# Patient Record
Sex: Male | Born: 1960 | Race: White | Hispanic: No | Marital: Married | Smoking: Never smoker
Health system: Southern US, Community
[De-identification: ages and names within clinical notes are randomized; demographics above are authoritative.]

## PROBLEM LIST (undated history)

## (undated) DIAGNOSIS — M23207 Derangement of unspecified meniscus due to old tear or injury, left knee: Secondary | ICD-10-CM

## (undated) DIAGNOSIS — G4733 Obstructive sleep apnea (adult) (pediatric): Secondary | ICD-10-CM

## (undated) DIAGNOSIS — M1990 Unspecified osteoarthritis, unspecified site: Secondary | ICD-10-CM

## (undated) DIAGNOSIS — Z0289 Encounter for other administrative examinations: Secondary | ICD-10-CM

## (undated) DIAGNOSIS — R011 Cardiac murmur, unspecified: Secondary | ICD-10-CM

## (undated) DIAGNOSIS — T7840XA Allergy, unspecified, initial encounter: Secondary | ICD-10-CM

## (undated) DIAGNOSIS — G4700 Insomnia, unspecified: Secondary | ICD-10-CM

## (undated) DIAGNOSIS — E785 Hyperlipidemia, unspecified: Secondary | ICD-10-CM

## (undated) DIAGNOSIS — E669 Obesity, unspecified: Secondary | ICD-10-CM

## (undated) DIAGNOSIS — E539 Vitamin B deficiency, unspecified: Secondary | ICD-10-CM

## (undated) DIAGNOSIS — F329 Major depressive disorder, single episode, unspecified: Secondary | ICD-10-CM

## (undated) DIAGNOSIS — F419 Anxiety disorder, unspecified: Secondary | ICD-10-CM

## (undated) DIAGNOSIS — F32A Depression, unspecified: Secondary | ICD-10-CM

## (undated) DIAGNOSIS — E291 Testicular hypofunction: Secondary | ICD-10-CM

## (undated) DIAGNOSIS — G4730 Sleep apnea, unspecified: Secondary | ICD-10-CM

## (undated) DIAGNOSIS — M25562 Pain in left knee: Secondary | ICD-10-CM

## (undated) DIAGNOSIS — M7662 Achilles tendinitis, left leg: Secondary | ICD-10-CM

## (undated) DIAGNOSIS — R718 Other abnormality of red blood cells: Secondary | ICD-10-CM

## (undated) DIAGNOSIS — R319 Hematuria, unspecified: Secondary | ICD-10-CM

## (undated) HISTORY — DX: Obesity, unspecified: E66.9

## (undated) HISTORY — DX: Derangement of unspecified meniscus due to old tear or injury, left knee: M23.207

## (undated) HISTORY — DX: Sleep apnea, unspecified: G47.30

## (undated) HISTORY — DX: Allergy, unspecified, initial encounter: T78.40XA

## (undated) HISTORY — DX: Hyperlipidemia, unspecified: E78.5

## (undated) HISTORY — DX: Pain in left knee: M25.562

## (undated) HISTORY — DX: Anxiety disorder, unspecified: F41.9

## (undated) HISTORY — DX: Other abnormality of red blood cells: R71.8

## (undated) HISTORY — DX: Depression, unspecified: F32.A

## (undated) HISTORY — DX: Achilles tendinitis, left leg: M76.62

## (undated) HISTORY — DX: Testicular hypofunction: E29.1

## (undated) HISTORY — DX: Hematuria, unspecified: R31.9

## (undated) HISTORY — DX: Unspecified osteoarthritis, unspecified site: M19.90

## (undated) HISTORY — DX: Cardiac murmur, unspecified: R01.1

## (undated) HISTORY — PX: VASECTOMY: SHX75

## (undated) HISTORY — DX: Insomnia, unspecified: G47.00

## (undated) HISTORY — DX: Encounter for other administrative examinations: Z02.89

## (undated) HISTORY — DX: Major depressive disorder, single episode, unspecified: F32.9

## (undated) HISTORY — DX: Obstructive sleep apnea (adult) (pediatric): G47.33

---

## 2010-06-15 LAB — HM COLONOSCOPY: HM Colonoscopy: NORMAL

## 2010-06-25 ENCOUNTER — Ambulatory Visit: Payer: Self-pay | Admitting: Gastroenterology

## 2011-01-10 ENCOUNTER — Ambulatory Visit: Payer: Self-pay | Admitting: Family Medicine

## 2011-05-09 ENCOUNTER — Ambulatory Visit: Payer: Self-pay | Admitting: Family Medicine

## 2014-01-25 LAB — PSA: PSA: NORMAL

## 2014-08-16 HISTORY — PX: KNEE SURGERY: SHX244

## 2014-08-29 ENCOUNTER — Ambulatory Visit: Payer: Self-pay | Admitting: Specialist

## 2014-08-30 LAB — HEMOGLOBIN A1C: Hgb A1c MFr Bld: 5.4 % (ref 4.0–6.0)

## 2014-09-12 ENCOUNTER — Ambulatory Visit: Payer: Self-pay | Admitting: Anesthesiology

## 2014-09-13 ENCOUNTER — Ambulatory Visit: Payer: Self-pay | Admitting: Specialist

## 2014-12-30 LAB — LIPID PANEL
Cholesterol: 153 mg/dL (ref 0–200)
HDL: 42 mg/dL (ref 35–70)
LDL Cholesterol: 85 mg/dL
Triglycerides: 130 mg/dL (ref 40–160)

## 2015-04-08 NOTE — Op Note (Signed)
PATIENT NAME:  Willie Atkins, Saman M MR#:  [id number] DATE OF BIRTH:  1960-12-18  DATE OF PROCEDURE:  09/13/2014  PREOPERATIVE DIAGNOSIS: Medial meniscus tear, left knee.   POSTOPERATIVE DIAGNOSES:  1.  Radial tear posterior horn medial meniscus, left knee.  2.  Medial suprapatellar plica.   PROCEDURES:  1.  Arthroscopic partial medial meniscectomy, left knee.  2.  Arthroscopic resection medial suprapatellar plica.   SURGEON: Myra Rudehristopher Darin Redmann, MD   ANESTHESIA: General.   COMPLICATIONS: None.   ESTIMATED BLOOD LOSS: 50 mL.   DESCRIPTION OF PROCEDURE: Two grams of Ancef was given intravenously prior to the procedure. General anesthesia was induced. The left lower extremity was placed in the leg holder in the usual manner for arthroscopy. The left knee and leg are thoroughly prepped with alcohol and ChloraPrep and draped in standard sterile fashion. The joint is infiltrated with 12 mL of Marcaine with epinephrine. Diagnostic arthroscopy is performed. There is mild synovitis in the suprapatellar pouch. There is a medial suprapatellar plica. There is moderate chondromalacia of the articular surface of the patella. The anterior femur surface is normal. In the medial compartment, there is a radial tear of the posterior horn of the medial meniscus. Using a combination of the full radial resector and the ArthroWand, the torn portion of the meniscus is resected back to a stable rim. The articular surfaces are normal in the medial compartment. In the intercondylar notch, the anterior cruciate ligament is intact. In the lateral compartment, the lateral meniscus is normal and the articular surfaces are normal. Attention is then returned to the medial suprapatellar plica and using the full radial resector and the ArthroWand, the torn portion of the meniscus is resected back out of harms way. The joint is thoroughly irrigated multiple times. Skin edges are closed with 5-0 nylon. The joint is infiltrated with 15 mL  of Marcaine with epinephrine and 4 mg of morphine. A soft bulky dressing is applied. The patient is returned to the recovery room in satisfactory condition having tolerated the procedure quite well.   ____________________________ Clare Gandyhristopher E. Majesty Stehlin, MD ces:TT D: 09/13/2014 14:16:03 ET T: 09/13/2014 14:37:21 ET JOB#: [id number]  cc: Clare Gandyhristopher E. Naod Sweetland, MD, <Dictator> Clare GandyHRISTOPHER E Zacheriah Stumpe MD ELECTRONICALLY SIGNED 09/16/2014 10:30

## 2015-08-14 ENCOUNTER — Encounter (INDEPENDENT_AMBULATORY_CARE_PROVIDER_SITE_OTHER): Payer: Self-pay

## 2015-08-14 ENCOUNTER — Ambulatory Visit (INDEPENDENT_AMBULATORY_CARE_PROVIDER_SITE_OTHER): Payer: 59 | Admitting: Family Medicine

## 2015-08-14 ENCOUNTER — Encounter: Payer: Self-pay | Admitting: Family Medicine

## 2015-08-14 VITALS — BP 122/86 | HR 87 | Temp 98.3°F | Resp 16 | Ht 73.0 in | Wt 291.2 lb

## 2015-08-14 DIAGNOSIS — Z79899 Other long term (current) drug therapy: Secondary | ICD-10-CM

## 2015-08-14 DIAGNOSIS — E8881 Metabolic syndrome: Secondary | ICD-10-CM | POA: Diagnosis not present

## 2015-08-14 DIAGNOSIS — Z23 Encounter for immunization: Secondary | ICD-10-CM | POA: Diagnosis not present

## 2015-08-14 DIAGNOSIS — E785 Hyperlipidemia, unspecified: Secondary | ICD-10-CM

## 2015-08-14 DIAGNOSIS — N644 Mastodynia: Secondary | ICD-10-CM

## 2015-08-14 DIAGNOSIS — K429 Umbilical hernia without obstruction or gangrene: Secondary | ICD-10-CM | POA: Diagnosis not present

## 2015-08-14 DIAGNOSIS — E291 Testicular hypofunction: Secondary | ICD-10-CM

## 2015-08-14 DIAGNOSIS — G4733 Obstructive sleep apnea (adult) (pediatric): Secondary | ICD-10-CM | POA: Diagnosis not present

## 2015-08-14 DIAGNOSIS — F418 Other specified anxiety disorders: Secondary | ICD-10-CM

## 2015-08-14 DIAGNOSIS — Z9889 Other specified postprocedural states: Secondary | ICD-10-CM | POA: Insufficient documentation

## 2015-08-14 DIAGNOSIS — E669 Obesity, unspecified: Secondary | ICD-10-CM

## 2015-08-14 DIAGNOSIS — Z9989 Dependence on other enabling machines and devices: Secondary | ICD-10-CM

## 2015-08-14 DIAGNOSIS — M7662 Achilles tendinitis, left leg: Secondary | ICD-10-CM | POA: Insufficient documentation

## 2015-08-14 DIAGNOSIS — D582 Other hemoglobinopathies: Secondary | ICD-10-CM | POA: Insufficient documentation

## 2015-08-14 MED ORDER — ALPRAZOLAM 0.5 MG PO TABS: 0.5000 mg | ORAL_TABLET | Freq: Every evening | ORAL | Status: DC | PRN

## 2015-08-14 NOTE — Progress Notes (Signed)
Name: Willie Atkins   MRN: [id number]    DOB: 01/30/1961   Date:08/14/2015       Progress Note  Subjective  Chief Complaint  Chief Complaint  Patient presents with  . Nipple Pain    Left side Onset-Months and getting worst. Has a stinging sensation when pressure is applied.     HPI  Nipple pain: started gradually over the past couple of months, getting more sensitive. There is constant discomfort, aching pain, and when touched it is sharp 4/10 pain, no nipple discharge, no breast masses, no trauma that he can recall. He takes Meloxicam daily for his knee and achilles tendinitis and is not controlling the pain.  Dyslipidemia: he is now off Trilipix and we will recheck labs to see if we will ned to resume medication.   OSA on CPAP: very complaint with CPAP machine, he wears it every night , all night, he notices a great improvement of his level of energy. Leg cramps resolved.   Metabolic Syndrome: he has gained weight since last visit, travelling more for his work and is eating out. He denies polyphagia, polyuria or polydipsia  Depression/Anxiety: he is still taking Citalopram and alprazolam prn at night. He is still having counseling with his wife. Just switched therapist because the previous one retired. He denies suicide thoughts or ideation.    Patient Active Problem List   Diagnosis Date Noted  . Dyslipidemia 08/14/2015  . Obesity (BMI 30-39.9) 08/14/2015  . Hypogonadism male 08/14/2015  . Metabolic syndrome 08/14/2015  . Depression with anxiety 08/14/2015  . OSA on CPAP 08/14/2015  . S/P arthroscopic surgery of left knee 08/14/2015  . Abnormal hemoglobin (Hgb) 08/14/2015    Past Surgical History  Procedure Laterality Date  . Knee surgery Left 08/2014  . Vasectomy      Family History  Problem Relation Age of Onset  . Cancer Mother   . Heart disease Father   . Stroke Father     Social History   Social History  . Marital Status: Married    Spouse Name: N/A  .  Number of Children: N/A  . Years of Education: N/A   Occupational History  . Not on file.   Social History Main Topics  . Smoking status: Never Smoker   . Smokeless tobacco: Never Used  . Alcohol Use: 4.2 oz/week    7 Standard drinks or equivalent per week     Comment: Wine with dinner nightly  . Drug Use: No  . Sexual Activity: No   Other Topics Concern  . Not on file   Social History Narrative     Current outpatient prescriptions:  .  ALPRAZolam (XANAX) 0.5 MG tablet, Take 1 tablet (0.5 mg total) by mouth at bedtime as needed for anxiety., Disp: 30 tablet, Rfl: 2 .  aspirin 81 MG tablet, Take 81 mg by mouth daily., Disp: , Rfl:  .  citalopram (CELEXA) 20 MG tablet, Take 20 mg by mouth daily., Disp: , Rfl:  .  meloxicam (MOBIC) 15 MG tablet, Take 15 mg by mouth daily., Disp: , Rfl:   No Known Allergies   ROS  Constitutional: Negative for fever and positive for weight gain .  Respiratory: Negative for cough and shortness of breath.   Cardiovascular: Negative for chest pain or palpitations.  Gastrointestinal: Negative for abdominal pain, no bowel changes.  Musculoskeletal: Negative for gait problem or joint swelling.  Skin: Negative for rash.  Neurological: Negative for dizziness or headache.  No  other specific complaints in a complete review of systems (except as listed in HPI above).  Objective  Filed Vitals:   08/14/15 0823  BP: 122/86  Pulse: 87  Temp: 98.3 F (36.8 C)  TempSrc: Oral  Resp: 16  Height:  (1.854 m)  Weight: 291 lb 3.2 oz (132.087 kg)  SpO2: 98%    Body mass index is 38.43 kg/(m^2).  Physical Exam  Constitutional: Patient appears well-developed and well-nourished. Obese  No distress.  HEENT: head atraumatic, normocephalic, pupils equal and reactive to light, neck supple, throat within normal limits Cardiovascular: Normal rate, regular rhythm and normal heart sounds.  No murmur heard. Trace BLE edema. Pulmonary/Chest: Effort normal  and breath sounds normal. No respiratory distress. Abdominal: Soft.  There is no tenderness. Psychiatric: Patient has a normal mood and affect. behavior is normal. Judgment and thought content normal. Breast:no masses, no nipple discharge, pain during palpation of left nipple, no redness..    PHQ2/9: Depression screen PHQ 2/9 08/14/2015  Decreased Interest 0  Down, Depressed, Hopeless 0  PHQ - 2 Score 0     Fall Risk: Fall Risk  08/14/2015  Falls in the past year? No      Assessment & Plan  1. Nipple tenderness Check labs, refer to surgeon, will hold off on imaging, did not feel any lumps - TSH - Prolactin - Ambulatory referral to General Surgery  2. Dyslipidemia  - Lipid panel  3. Need for vaccination for H flu type B  - Flu Vaccine QUAD 36+ mos PF IM (Fluarix & Fluzone Quad PF)  4. OSA on CPAP Continue CPAP nightly   5. Obesity (BMI 30-39.9) Discussed with the patient the risk posed by an increased BMI. Discussed importance of portion control, calorie counting and at least 150 minutes of physical activity weekly. Avoid sweet beverages and drink more water. Eat at least 6 servings of fruit and vegetables daily   6. Hypogonadism male Off testosterone, because it caused elevation of HCT  7. Metabolic syndrome  - Hemoglobin A1c  8. Depression with anxiety  - ALPRAZolam (XANAX) 0.5 MG tablet; Take 1 tablet (0.5 mg total) by mouth at bedtime as needed for anxiety.  Dispense: 30 tablet; Refill: 2  9. Long-term use of high-risk medication  - Comprehensive metabolic panel

## 2015-08-15 ENCOUNTER — Encounter: Payer: Self-pay | Admitting: Family Medicine

## 2015-08-15 DIAGNOSIS — J309 Allergic rhinitis, unspecified: Secondary | ICD-10-CM | POA: Insufficient documentation

## 2015-08-15 DIAGNOSIS — I059 Rheumatic mitral valve disease, unspecified: Secondary | ICD-10-CM | POA: Insufficient documentation

## 2015-08-15 DIAGNOSIS — M23209 Derangement of unspecified meniscus due to old tear or injury, unspecified knee: Secondary | ICD-10-CM | POA: Insufficient documentation

## 2015-08-15 LAB — COMPREHENSIVE METABOLIC PANEL
ALBUMIN: 4.6 g/dL (ref 3.5–5.5)
ALK PHOS: 53 IU/L (ref 39–117)
ALT: 49 IU/L — ABNORMAL HIGH (ref 0–44)
AST: 23 IU/L (ref 0–40)
Albumin/Globulin Ratio: 1.9 (ref 1.1–2.5)
BILIRUBIN TOTAL: 0.5 mg/dL (ref 0.0–1.2)
BUN / CREAT RATIO: 21 — AB (ref 9–20)
BUN: 24 mg/dL (ref 6–24)
CHLORIDE: 103 mmol/L (ref 97–108)
CO2: 22 mmol/L (ref 18–29)
Calcium: 9.6 mg/dL (ref 8.7–10.2)
Creatinine, Ser: 1.14 mg/dL (ref 0.76–1.27)
GFR calc Af Amer: 83 mL/min/{1.73_m2} (ref >59)
GFR calc non Af Amer: 71 mL/min/{1.73_m2} (ref >59)
GLOBULIN, TOTAL: 2.4 g/dL (ref 1.5–4.5)
GLUCOSE: 100 mg/dL — AB (ref 65–99)
POTASSIUM: 4.6 mmol/L (ref 3.5–5.2)
SODIUM: 141 mmol/L (ref 134–144)
Total Protein: 7 g/dL (ref 6.0–8.5)

## 2015-08-15 LAB — HEMOGLOBIN A1C
Est. average glucose Bld gHb Est-mCnc: 114 mg/dL
Hgb A1c MFr Bld: 5.6 % (ref 4.8–5.6)

## 2015-08-15 LAB — TSH: TSH: 1.53 u[IU]/mL (ref 0.450–4.500)

## 2015-08-15 LAB — LIPID PANEL
CHOLESTEROL TOTAL: 163 mg/dL (ref 100–199)
Chol/HDL Ratio: 4.4 ratio units (ref 0.0–5.0)
HDL: 37 mg/dL — ABNORMAL LOW (ref >39)
LDL Calculated: 94 mg/dL (ref 0–99)
Triglycerides: 159 mg/dL — ABNORMAL HIGH (ref 0–149)
VLDL Cholesterol Cal: 32 mg/dL (ref 5–40)

## 2015-08-15 LAB — PROLACTIN: PROLACTIN: 7.5 ng/mL (ref 4.0–15.2)

## 2015-08-15 NOTE — Progress Notes (Signed)
Pt.notified

## 2015-08-17 ENCOUNTER — Encounter: Payer: Self-pay | Admitting: Family Medicine

## 2015-08-17 ENCOUNTER — Encounter: Payer: Self-pay | Admitting: *Deleted

## 2015-08-29 ENCOUNTER — Ambulatory Visit: Payer: Self-pay | Admitting: Family Medicine

## 2015-08-30 ENCOUNTER — Ambulatory Visit (INDEPENDENT_AMBULATORY_CARE_PROVIDER_SITE_OTHER): Payer: 59 | Admitting: General Surgery

## 2015-08-30 ENCOUNTER — Encounter: Payer: Self-pay | Admitting: General Surgery

## 2015-08-30 ENCOUNTER — Other Ambulatory Visit: Payer: 59

## 2015-08-30 VITALS — BP 126/82 | HR 87 | Resp 16 | Ht 74.0 in | Wt 292.0 lb

## 2015-08-30 DIAGNOSIS — N644 Mastodynia: Secondary | ICD-10-CM

## 2015-08-30 DIAGNOSIS — N62 Hypertrophy of breast: Secondary | ICD-10-CM

## 2015-08-30 NOTE — Patient Instructions (Signed)
Call the office with any questions and concerns.

## 2015-08-30 NOTE — Progress Notes (Signed)
Patient ID: Willie Atkins, male   DOB: 05-24-1961, 54 y.o.   MRN: [id number]  Chief Complaint  Patient presents with  . Breast Problem    nipple tenderness    HPI Willie Atkins is a 54 y.o. male here today for evaluation for left nipple tenderness. He states that the nipple is tender to the touch. He first noticed it 3 months ago. No complaints of color changing or discharge in the nipple. He does not feel there is any swelling in the area. Denies any injury to his chest. Patient does juggle for his hobby for about 2 hours every Saturday, he has been juggling since he was a teenager. 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 He reports his weight has increased about 10-15 lbs in the past couple of months. The patient was making use of testosterone supplementations until approximately 1 year ago. By report this was discontinued because of some blood work abnormalities.   Review of his medications do not show any with a strong history of gynecomastia. He denies recreational drug use.  HPI  Past Medical History  Diagnosis Date  . Old tear of meniscus of left knee   . OSA (obstructive sleep apnea)   . Hyperlipidemia   . Testicular failure   . Depression   . Elevated hematocrit   . Tendonitis, Achilles, left   . Hematuria syndrome   . Left knee pain   . Obesity   . Allergy   . Insomnia   . Pain medication agreement     Past Surgical History  Procedure Laterality Date  . Knee surgery Left 08/2014  . Vasectomy      Family History  Problem Relation Age of Onset  . Cancer Mother   . Heart disease Father   . Stroke Father     Social History Social History  Substance Use Topics  . Smoking status: Never Smoker   . Smokeless tobacco: Never Used  . Alcohol Use: 4.2 oz/week    7 Standard drinks or equivalent per week     Comment: Wine with dinner nightly    No Known Allergies  Current Outpatient Prescriptions  Medication Sig Dispense Refill  . ALPRAZolam (XANAX) 0.5  MG tablet Take 1 tablet (0.5 mg total) by mouth at bedtime as needed for anxiety. 30 tablet 2  . aspirin 81 MG tablet Take 81 mg by mouth daily.    . citalopram (CELEXA) 20 MG tablet Take 20 mg by mouth daily.    . meloxicam (MOBIC) 15 MG tablet Take 15 mg by mouth daily.     No current facility-administered medications for this visit.    Review of Systems Review of Systems  Constitutional: Negative.   Respiratory: Negative.   Cardiovascular: Negative.     Blood pressure 126/82, pulse 87, resp. rate 16, height  (1.88 m), weight 292 lb (132.45 kg).  Physical Exam Physical Exam  Constitutional: He is oriented to person, place, and time. He appears well-developed and well-nourished.  HENT:  Mouth/Throat: Oropharynx is clear and moist.  Eyes: Conjunctivae are normal. No scleral icterus.  Neck: Neck supple.  Cardiovascular: Normal rate, regular rhythm and normal heart sounds.   Pulmonary/Chest: Effort normal and breath sounds normal. Right breast exhibits no inverted nipple, no mass, no nipple discharge, no skin change and no tenderness. Left breast exhibits tenderness (areola including nipple). Left breast exhibits no inverted nipple, no mass, no nipple discharge and no skin change.    Lymphadenopathy:  He has no cervical adenopathy.    He has no axillary adenopathy.  Neurological: He is alert and oriented to person, place, and time.  Skin: Skin is warm and dry.  Psychiatric: His behavior is normal.    Data Reviewed Ultrasound examination was completed to assess for asymmetry in the retroareolar tissue. In the left breast the retroareolar tissue showed an irregular hypoechoic pattern measuring 1.04 x 1.15 x 2.07. On the right side maximum dimensions were 0.5 x 0.7 x 1.0 cm. BI-RADS-4.   Assessment    Left nipple tenderness secondary to retroareolar mass.    Plan    I suspect that this is gynecomastia, and biopsy was recommended to confirm. The procedure was  reviewed.  The skin was prepped with alcohol after clipping the generous layer of hair present. 10 mL of 0.5% Xylocaine with 0.25% Marcaine with 1-200,000 of epinephrine was utilized well tolerated. A 14-gauge Finesse vacuum biopsy device was utilized and 8 core samples were obtained from 2 locations in the retroareolar mass of the left breast. Scant bleeding was noted. The skin defect was closed with benzoin and Steri-Strip and this was followed by a Telfa and Tegaderm dressing.  The patient tolerated the procedure well. Ice pack applied. Postbiopsy instructions sheet provided.  He'll return in one week for wound evaluation by the staff. If biopsy is benign would plan on a 3 month follow-up with the physician.       Left Breat biopsy.   PCP: Dimitri Ped 08/31/2015, 6:22 AM

## 2015-08-31 ENCOUNTER — Telehealth: Payer: Self-pay | Admitting: General Surgery

## 2015-08-31 DIAGNOSIS — N644 Mastodynia: Secondary | ICD-10-CM | POA: Insufficient documentation

## 2015-08-31 DIAGNOSIS — N62 Hypertrophy of breast: Secondary | ICD-10-CM | POA: Insufficient documentation

## 2015-08-31 NOTE — Telephone Encounter (Signed)
The patient was notified of the pathology was benign and CCU angiomatous stromal hyperplasia) sees. He reports tolerating the procedure well.  He'll present next week for wound evaluation by the staff. We'll plan on office exam in 3 months, earlier if the area becomes more symptomatic. If that does develop, we'll consider resection of the retroareolar tissue.

## 2015-09-11 ENCOUNTER — Ambulatory Visit (INDEPENDENT_AMBULATORY_CARE_PROVIDER_SITE_OTHER): Payer: 59 | Admitting: *Deleted

## 2015-09-11 DIAGNOSIS — N644 Mastodynia: Secondary | ICD-10-CM

## 2015-09-11 NOTE — Progress Notes (Signed)
Patient here today for follow up post left  breast biopsy. Minimal bruising noted.  The patient is aware that a heating pad may be used for comfort as needed.  Aware of pathology. Follow up as scheduled. 

## 2015-12-19 ENCOUNTER — Ambulatory Visit (INDEPENDENT_AMBULATORY_CARE_PROVIDER_SITE_OTHER): Payer: 59 | Admitting: Family Medicine

## 2015-12-19 ENCOUNTER — Encounter: Payer: Self-pay | Admitting: Family Medicine

## 2015-12-19 VITALS — BP 122/74 | HR 88 | Temp 99.6°F | Resp 16 | Ht 74.0 in | Wt 292.9 lb

## 2015-12-19 DIAGNOSIS — M7662 Achilles tendinitis, left leg: Secondary | ICD-10-CM

## 2015-12-19 DIAGNOSIS — N644 Mastodynia: Secondary | ICD-10-CM | POA: Diagnosis not present

## 2015-12-19 DIAGNOSIS — E669 Obesity, unspecified: Secondary | ICD-10-CM

## 2015-12-19 DIAGNOSIS — E785 Hyperlipidemia, unspecified: Secondary | ICD-10-CM | POA: Diagnosis not present

## 2015-12-19 DIAGNOSIS — G4733 Obstructive sleep apnea (adult) (pediatric): Secondary | ICD-10-CM

## 2015-12-19 DIAGNOSIS — E8881 Metabolic syndrome: Secondary | ICD-10-CM | POA: Diagnosis not present

## 2015-12-19 DIAGNOSIS — F418 Other specified anxiety disorders: Secondary | ICD-10-CM

## 2015-12-19 DIAGNOSIS — E291 Testicular hypofunction: Secondary | ICD-10-CM

## 2015-12-19 DIAGNOSIS — Z9989 Dependence on other enabling machines and devices: Secondary | ICD-10-CM

## 2015-12-19 MED ORDER — ALPRAZOLAM 0.5 MG PO TABS: 0.5000 mg | ORAL_TABLET | Freq: Every evening | ORAL | Status: DC | PRN

## 2015-12-19 MED ORDER — CITALOPRAM HYDROBROMIDE 20 MG PO TABS: 20.0000 mg | ORAL_TABLET | Freq: Every day | ORAL | Status: DC

## 2015-12-19 MED ORDER — MELOXICAM 15 MG PO TABS: 15.0000 mg | ORAL_TABLET | Freq: Every day | ORAL | Status: DC

## 2015-12-19 NOTE — Progress Notes (Signed)
Name: Willie Atkins   MRN: [id number]    DOB: October 31, 1961   Date:12/19/2015       Progress Note  Subjective  Chief Complaint  Chief Complaint  Patient presents with  . Medication Refill    4 month F/U  . Depression with Anxiety    Well controlled  . Achilles tendinitis    Meloxicam is helping with tenderness    HPI  Dyslipidemia: he is now off Trilipix, triglycerides slightly up after stopped medication, but I do not recommend resuming it. Discussed ways to increase HDL level and decrease triglycerides.   OSA on CPAP: very complaint with CPAP machine, he wears it every night , all night, he notices a great improvement of his level of energy. Leg cramps resolved.   Metabolic Syndrome: he did not gain weight through the holidays. Last hgbA1C 5.6% 07/2015.  He denies polyphagia, polyuria or polydipsia  Depression/Anxiety: he is still taking Citalopram and alprazolam prn at night. He is still having counseling with his wife. He denies suicide thoughts or ideation . He has a normal level of energy.   Nipple pain: started gradually over the past six  months. Pain is no longer constant, only when he applies pressure to the area, seen by Dr. Birdie SonsByrnette, biopsy negative, mild gynecomastia on left side. Feeling better now that he has been reassured.   Left achilles tendinitis: seen by Dr. Katrinka BlazingSmith, problem is present for the past few year, taking Meloxicam and if out for a couple of weeks pain is severe, at this time 1-2/10, under control.   Patient Active Problem List   Diagnosis Date Noted  . Breast tenderness in male 08/31/2015  . Gynecomastia, male 08/31/2015  . Allergic rhinitis 08/15/2015  . Dysmetabolic syndrome 08/15/2015  . Mitral valve disorder 08/15/2015  . 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 Dyslipidemia 08/14/2015  . Obesity (BMI 30-39.9) 08/14/2015  . Hypogonadism male 08/14/2015  . Metabolic syndrome 08/14/2015  . Depression with anxiety 08/14/2015  . OSA on CPAP  08/14/2015  . S/P arthroscopic surgery of left knee 08/14/2015  . Abnormal hemoglobin (Hgb) (HCC) 08/14/2015  . Umbilical hernia without obstruction and without gangrene 08/14/2015  . Left Achilles tendinitis 08/14/2015    Past Surgical History  Procedure Laterality Date  . Knee surgery Left 08/2014  . Vasectomy      Family History  Problem Relation Age of Onset  . Cancer Mother   . Heart disease Father   . Stroke Father     Social History   Social History  . Marital Status: Married    Spouse Name: N/A  . Number of Children: N/A  . Years of Education: N/A   Occupational History  . Not on file.   Social History Main Topics  . Smoking status: Never Smoker   . Smokeless tobacco: Never Used  . Alcohol Use: 4.2 oz/week    7 Standard drinks or equivalent per week     Comment: Wine with dinner nightly  . Drug Use: No  . Sexual Activity: No   Other Topics Concern  . Not on file   Social History Narrative     Current outpatient prescriptions:  .  ALPRAZolam (XANAX) 0.5 MG tablet, Take 1 tablet (0.5 mg total) by mouth at bedtime as needed for anxiety., Disp: 30 tablet, Rfl: 2 .  aspirin 81 MG tablet, Take 81 mg by mouth daily., Disp: , Rfl:  .  citalopram (CELEXA) 20 MG tablet, Take 1 tablet (20  mg total) by mouth daily., Disp: 90 tablet, Rfl: 1 .  meloxicam (MOBIC) 15 MG tablet, Take 1 tablet (15 mg total) by mouth daily., Disp: 90 tablet, Rfl: 1  No Known Allergies   ROS  Constitutional: Negative for fever or weight change.  Respiratory: Negative for cough and shortness of breath.   Cardiovascular: Negative for chest pain or palpitations.  Gastrointestinal: Negative for abdominal pain, no bowel changes.  Musculoskeletal: Negative for gait problem or joint swelling.  Skin: Negative for rash.  Neurological: Negative for dizziness or headache.  No other specific complaints in a complete review of systems (except as listed in HPI above).  Objective  Filed  Vitals:   12/19/15 0915  BP: 122/74  Pulse: 88  Temp: 99.6 F (37.6 C)  TempSrc: Oral  Resp: 16  Height: 6\' 2"  (1.88 m)  Weight: 292 lb 14.4 oz (132.859 kg)  SpO2: 97%    Body mass index is 37.59 kg/(m^2).  Physical Exam  Constitutional: Patient appears well-developed and well-nourished. Obese  No distress.  HEENT: head atraumatic, normocephalic, pupils equal and reactive to light,  neck supple, throat within normal limits Cardiovascular: Normal rate, regular rhythm and normal heart sounds.  No murmur heard. No BLE edema. Pulmonary/Chest: Effort normal and breath sounds normal. No respiratory distress. Abdominal: Soft.  There is no tenderness. Psychiatric: Patient has a normal mood and affect. behavior is normal. Judgment and thought content normal.  PHQ2/9: Depression screen [facility] 2/9 12/19/2015 08/14/2015  Decreased Interest 0 0  Down, Depressed, Hopeless 0 0  PHQ - 2 Score 0 0     Fall Risk: Fall Risk  12/19/2015 08/14/2015  Falls in the past year? Yes No  Number falls in past yr: 1 -  Injury with Fall? No -      Functional Status Survey: Is the patient deaf or have difficulty hearing?: No Does the patient have difficulty seeing, even when wearing glasses/contacts?: No Does the patient have difficulty concentrating, remembering, or making decisions?: No Does the patient have difficulty walking or climbing stairs?: No Does the patient have difficulty dressing or bathing?: No Does the patient have difficulty doing errands alone such as visiting a doctor's office or shopping?: No    Assessment & Plan  1. Dyslipidemia  Lipid panel shows low HDL : to improve HDL patient  needs to eat tree nuts ( pecans/pistachios/almonds ) four times weekly, eat fish two times weekly  and exercise  at least 150 minutes per week  2. Metabolic syndrome  Discussed diet and importance of weight again   3. OSA on CPAP  Continue CPAP use every night  4. Obesity (BMI  30-39.9)  Discussed with the patient the risk posed by an increased BMI. Discussed importance of portion control, calorie counting and at least 150 minutes of physical activity weekly. Avoid sweet beverages and drink more water. Eat at least 6 servings of fruit and vegetables daily   5. Hypogonadism male  Off medications,  It was causing hgb to go up. He is okay being off medication at this time  6. Depression with anxiety  Stable on medication, continue therapy  - citalopram (CELEXA) 20 MG tablet; Take 1 tablet (20 mg total) by mouth daily.  Dispense: 90 tablet; Refill: 1 - ALPRAZolam (XANAX) 0.5 MG tablet; Take 1 tablet (0.5 mg total) by mouth at bedtime as needed for anxiety.  Dispense: 30 tablet; Refill: 2  7. Left Achilles tendinitis  Discussed stretching exercises - meloxicam (MOBIC) 15 MG  tablet; Take 1 tablet (15 mg total) by mouth daily.  Dispense: 90 tablet; Refill: 1

## 2015-12-19 NOTE — Patient Instructions (Signed)
Achilles Tendinitis Achilles tendinitis is inflammation of the tough, cord-like band that attaches the lower muscles of your leg to your heel (Achilles tendon). It is usually caused by overusing the tendon and joint involved.  CAUSES Achilles tendinitis can happen because of:  A sudden increase in exercise or activity (such as running).  Doing the same exercises or activities (such as jumping) over and over.  Not warming up calf muscles before exercising.  Exercising in shoes that are worn out or not made for exercise.  Having arthritis or a bone growth on the back of the heel bone. This can rub against the tendon and hurt the tendon. SIGNS AND SYMPTOMS The most common symptoms are:  Pain in the back of the leg, just above the heel. The pain usually gets worse with exercise and better with rest.  Stiffness or soreness in the back of the leg, especially in the morning.  Swelling of the skin over the Achilles tendon.  Trouble standing on tiptoe. Sometimes, an Achilles tendon tears (ruptures). Symptoms of an Achilles tendon rupture can include:  Sudden, severe pain in the back of the leg.  Trouble putting weight on the foot or walking normally. DIAGNOSIS Achilles tendinitis will be diagnosed based on symptoms and a physical examination. An X-ray may be done to check if another condition is causing your symptoms. An MRI may be ordered if your health care provider suspects you may have completely torn your tendon, which is called an Achilles tendon rupture.  TREATMENT  Achilles tendinitis usually gets better over time. It can take weeks to months to heal completely. Treatment focuses on treating the symptoms and helping the injury heal. HOME CARE INSTRUCTIONS   Rest your Achilles tendon and avoid activities that cause pain.  Apply ice to the injured area:  Put ice in a plastic bag.  Place a towel between your skin and the bag.  Leave the ice on for 20 minutes, 2-3 times a  day  Try to avoid using the tendon (other than gentle range of motion) while the tendon is painful. Do not resume use until instructed by your health care provider. Then begin use gradually. Do not increase use to the point of pain. If pain does develop, decrease use and continue the above measures. Gradually increase activities that do not cause discomfort until you achieve normal use.  Do exercises to make your calf muscles stronger and more flexible. Your health care provider or physical therapist can recommend exercises for you to do.  Wrap your ankle with an elastic bandage or other wrap. This can help keep your tendon from moving too much. Your health care provider will show you how to wrap your ankle correctly.  Only take over-the-counter or prescription medicines for pain, discomfort, or fever as directed by your health care provider. SEEK MEDICAL CARE IF:   Your pain and swelling increase or pain is uncontrolled with medicines.  You develop new, unexplained symptoms or your symptoms get worse.  You are unable to move your toes or foot.  You develop warmth and swelling in your foot.  You have an unexplained temperature. MAKE SURE YOU:   Understand these instructions.  Will watch your condition.  Will get help right away if you are not doing well or get worse.   This information is not intended to replace advice given to you by your health care provider. Make sure you discuss any questions you have with your health care provider.   Document Released:   09/11/2005 Document Revised: 12/23/2014 Document Reviewed: 07/14/2013 Elsevier Interactive Patient Education 2016 [employer]  

## 2016-04-17 ENCOUNTER — Ambulatory Visit: Payer: 59 | Admitting: Family Medicine

## 2016-04-24 ENCOUNTER — Telehealth: Payer: Self-pay

## 2016-04-24 MED ORDER — AMOXICILLIN 500 MG PO CAPS: 2000.0000 mg | ORAL_CAPSULE | Freq: Once | ORAL | Status: DC

## 2016-04-24 NOTE — Telephone Encounter (Signed)
Patient's wife called wanting to know if her husband needs an antibiotic prior to his routine dental cleaning based upon his past history. i told her that I would send Dr. Carlynn PurlSowles the message and would give her a call back.

## 2016-04-24 NOTE — Telephone Encounter (Signed)
Sent amoxicillin.

## 2016-04-30 ENCOUNTER — Encounter: Payer: Self-pay | Admitting: Family Medicine

## 2016-04-30 ENCOUNTER — Ambulatory Visit (INDEPENDENT_AMBULATORY_CARE_PROVIDER_SITE_OTHER): Payer: 59 | Admitting: Family Medicine

## 2016-04-30 VITALS — BP 118/74 | HR 92 | Temp 98.6°F | Resp 16 | Ht 74.0 in | Wt 294.7 lb

## 2016-04-30 DIAGNOSIS — M7662 Achilles tendinitis, left leg: Secondary | ICD-10-CM

## 2016-04-30 DIAGNOSIS — G4733 Obstructive sleep apnea (adult) (pediatric): Secondary | ICD-10-CM

## 2016-04-30 DIAGNOSIS — M722 Plantar fascial fibromatosis: Secondary | ICD-10-CM

## 2016-04-30 DIAGNOSIS — Z9989 Dependence on other enabling machines and devices: Secondary | ICD-10-CM

## 2016-04-30 DIAGNOSIS — E785 Hyperlipidemia, unspecified: Secondary | ICD-10-CM | POA: Diagnosis not present

## 2016-04-30 DIAGNOSIS — E8881 Metabolic syndrome: Secondary | ICD-10-CM

## 2016-04-30 DIAGNOSIS — F418 Other specified anxiety disorders: Secondary | ICD-10-CM | POA: Diagnosis not present

## 2016-04-30 DIAGNOSIS — G4700 Insomnia, unspecified: Secondary | ICD-10-CM | POA: Diagnosis not present

## 2016-04-30 MED ORDER — ALPRAZOLAM 0.5 MG PO TABS: 0.5000 mg | ORAL_TABLET | Freq: Every evening | ORAL | Status: DC | PRN

## 2016-04-30 NOTE — Patient Instructions (Signed)
Plantar Fasciitis Plantar fasciitis is a painful foot condition that affects the heel. It occurs when the band of tissue that connects the toes to the heel bone (plantar fascia) becomes irritated. This can happen after exercising too much or doing other repetitive activities (overuse injury). The pain from plantar fasciitis can range from mild irritation to severe pain that makes it difficult for you to walk or move. The pain is usually worse in the morning or after you have been sitting or lying down for a while. CAUSES This condition may be caused by:  Standing for long periods of time.  Wearing shoes that do not fit.  Doing high-impact activities, including running, aerobics, and ballet.  Being overweight.  Having an abnormal way of walking (gait).  Having tight calf muscles.  Having high arches in your feet.  Starting a new athletic activity. SYMPTOMS The main symptom of this condition is heel pain. Other symptoms include:  Pain that gets worse after activity or exercise.  Pain that is worse in the morning or after resting.  Pain that goes away after you walk for a few minutes. DIAGNOSIS This condition may be diagnosed based on your signs and symptoms. Your health care provider will also do a physical exam to check for:  A tender area on the bottom of your foot.  A high arch in your foot.  Pain when you move your foot.  Difficulty moving your foot. You may also need to have imaging studies to confirm the diagnosis. These can include:  X-rays.  Ultrasound.  MRI. TREATMENT  Treatment for plantar fasciitis depends on the severity of the condition. Your treatment may include:  Rest, ice, and over-the-counter pain medicines to manage your pain.  Exercises to stretch your calves and your plantar fascia.  A splint that holds your foot in a stretched, upward position while you sleep (night splint).  Physical therapy to relieve symptoms and prevent problems in the  future.  Cortisone injections to relieve severe pain.  Extracorporeal shock wave therapy (ESWT) to stimulate damaged plantar fascia with electrical impulses. It is often used as a last resort before surgery.  Surgery, if other treatments have not worked after 12 months. HOME CARE INSTRUCTIONS  Take medicines only as directed by your health care provider.  Avoid activities that cause pain.  Roll the bottom of your foot over a bag of ice or a bottle of cold water. Do this for 20 minutes, 3-4 times a day.  Perform simple stretches as directed by your health care provider.  Try wearing athletic shoes with air-sole or gel-sole cushions or soft shoe inserts.  Wear a night splint while sleeping, if directed by your health care provider.  Keep all follow-up appointments with your health care provider. PREVENTION   Do not perform exercises or activities that cause heel pain.  Consider finding low-impact activities if you continue to have problems.  Lose weight if you need to. The best way to prevent plantar fasciitis is to avoid the activities that aggravate your plantar fascia. SEEK MEDICAL CARE IF:  Your symptoms do not go away after treatment with home care measures.  Your pain gets worse.  Your pain affects your ability to move or do your daily activities.   This information is not intended to replace advice given to you by your health care provider. Make sure you discuss any questions you have with your health care provider.   Document Released: 08/27/2001 Document Revised: 08/23/2015 Document Reviewed: 10/12/2014 Elsevier   Interactive Patient Education 2016 [employer] Inc.  

## 2016-04-30 NOTE — Progress Notes (Signed)
Name: Willie Atkins   MRN: [id number]    DOB: [birth year]   Date:04/30/2016       Progress Note  Subjective  Chief Complaint  Chief Complaint  Patient presents with  . Follow-up    patient is here for his 36-month f/u  . Medication Refill    HPI  Dyslipidemia: he is now off Trilipix, triglycerides slightly up after stopped medication, but I do not recommend resuming it. Discussed ways to increase HDL level and decrease triglycerides. To improve HDL patient  needs to eat tree nuts ( pecans/pistachios/almonds ) four times weekly, eat fish two times weekly  and exercise  at least 150 minutes per week  OSA on CPAP: very complaint with CPAP machine, he wears it every night , all night, he notices a great improvement of his level of energy. Leg cramps resolved.   Metabolic Syndrome: Last hgbA1C 5.6% 07/2015. He denies polyphagia, polyuria or polydipsia.  Depression/Anxiety: he is still taking Citalopram and alprazolam prn at night. He is still having marital counseling . Wife still unable to have intercourse. He denies suicide thoughts or ideation . He has a normal level of energy. He likes his job, also works as Educational psychologist and baseball.   Nipple pain: started gradually over the past six months. Pain is no longer constant, only when he applies pressure to the area, seen by Dr. Birdie Sons, biopsy negative, mild gynecomastia on left side. Feeling better now that he has been reassured.   Left achilles tendinitis: seen by Dr. Katrinka Blazing, problem is present for the past few year, taking Meloxicam and if out for a couple of weeks pain is severe, at this time 2/10, under control.   Right plantar fascitis: he states pain started about 1 year ago, at rest no pain, but after some activities the pain goes up. After umpiring a baseball game the pain can go up to 6/10    Patient Active Problem List   Diagnosis Date Noted  . Breast tenderness in male 08/31/2015  . Gynecomastia, male 08/31/2015  .  Allergic rhinitis 08/15/2015  . Dysmetabolic syndrome 08/15/2015  . Mitral valve disorder 08/15/2015  . 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 Dyslipidemia 08/14/2015  . Obesity (BMI 30-39.9) 08/14/2015  . Hypogonadism male 08/14/2015  . Metabolic syndrome 08/14/2015  . Depression with anxiety 08/14/2015  . OSA on CPAP 08/14/2015  . S/P arthroscopic surgery of left knee 08/14/2015  . Abnormal hemoglobin (Hgb) (HCC) 08/14/2015  . Umbilical hernia without obstruction and without gangrene 08/14/2015  . Left Achilles tendinitis 08/14/2015    Past Surgical History  Procedure Laterality Date  . Knee surgery Left 08/2014  . Vasectomy      Family History  Problem Relation Age of Onset  . Cancer Mother   . Heart disease Father   . Stroke Father     Social History   Social History  . Marital Status: Married    Spouse Name: N/A  . Number of Children: N/A  . Years of Education: N/A   Occupational History  . Not on file.   Social History Main Topics  . Smoking status: Never Smoker   . Smokeless tobacco: Never Used  . Alcohol Use: 4.2 oz/week    7 Standard drinks or equivalent per week     Comment: Wine with dinner nightly  . Drug Use: No  . Sexual Activity: No   Other Topics Concern  . Not on file   Social History Narrative  Current outpatient prescriptions:  .  ALPRAZolam (XANAX) 0.5 MG tablet, Take 1 tablet (0.5 mg total) by mouth at bedtime as needed for anxiety., Disp: 30 tablet, Rfl: 2 .  aspirin 81 MG tablet, Take 81 mg by mouth daily., Disp: , Rfl:  .  citalopram (CELEXA) 20 MG tablet, Take 1 tablet (20 mg total) by mouth daily., Disp: 90 tablet, Rfl: 1 .  meloxicam (MOBIC) 15 MG tablet, Take 1 tablet (15 mg total) by mouth daily., Disp: 90 tablet, Rfl: 1  No Known Allergies   ROS  Constitutional: Negative for fever or weight change.  Respiratory: Negative for cough and shortness of breath.   Cardiovascular: Negative for chest pain or  palpitations.  Gastrointestinal: Negative for abdominal pain, no bowel changes.  Musculoskeletal: Negative for gait problem or joint swelling.  Skin: Negative for rash.  Neurological: Negative for dizziness or headache.  No other specific complaints in a complete review of systems (except as listed in HPI above).  Objective  Filed Vitals:   04/30/16 0745  BP: 118/74  Pulse: 92  Temp: 98.6 F (37 C)  TempSrc: Oral  Resp: 16  Height: 6\' 2"  (1.88 m)  Weight: 294 lb 11.2 oz (133.675 kg)  SpO2: 97%    Body mass index is 37.82 kg/(m^2).  Physical Exam  Constitutional: Patient appears well-developed and well-nourished. Obese  No distress.  HEENT: head atraumatic, normocephalic, pupils equal and reactive to light,  neck supple, throat within normal limits Cardiovascular: Normal rate, regular rhythm and normal heart sounds.  No murmur heard. No BLE edema. Pulmonary/Chest: Effort normal and breath sounds normal. No respiratory distress. Abdominal: Soft.  There is no tenderness. Psychiatric: Patient has a normal mood and affect. behavior is normal. Judgment and thought content normal.  PHQ2/9: Depression screen [facility] 2/[facility] 04/30/2016 12/19/2015 08/14/2015  Decreased Interest 0 0 0  Down, Depressed, Hopeless 0 0 0  PHQ - 2 Score 0 0 0    Fall Risk: Fall Risk  04/30/2016 12/19/2015 08/14/2015  Falls in the past year? No Yes No  Number falls in past yr: - 1 -  Injury with Fall? - No -    Functional Status Survey: Is the patient deaf or have difficulty hearing?: No Does the patient have difficulty seeing, even when wearing glasses/contacts?: No Does the patient have difficulty concentrating, remembering, or making decisions?: No Does the patient have difficulty walking or climbing stairs?: No Does the patient have difficulty dressing or bathing?: No Does the patient have difficulty doing errands alone such as visiting a doctor's office or shopping?: No    Assessment & Plan  1.  Metabolic syndrome  He has not been as complaint with his diet lately, but he will resume  2. OSA on CPAP  Continue CPAP use  3. Dyslipidemia  Discussed life style modification   4. Depression with anxiety  stable  5. Insomnia  - ALPRAZolam (XANAX) 0.5 MG tablet; Take 1 tablet (0.5 mg total) by mouth at bedtime as needed for anxiety.  Dispense: 30 tablet; Refill: 2  6. Left Achilles tendinitis  Continue Meloxicam , discussed possible side effects again  7. Plantar fasciitis, right  Try home exercises and consider podiatrist

## 2016-05-01 ENCOUNTER — Encounter: Payer: Self-pay | Admitting: Family Medicine

## 2016-05-06 ENCOUNTER — Telehealth: Payer: Self-pay | Admitting: Family Medicine

## 2016-05-06 NOTE — Telephone Encounter (Signed)
Received fax from [facility] stating that they have made several attempts to contact patient to schedule their appointment.  [facility] also stated that they need a copy of the most recent office notes documenting use and why new machine is needed.  A copy of the fax has been scanned in the patient's chart.

## 2016-07-17 ENCOUNTER — Encounter: Payer: Self-pay | Admitting: Podiatry

## 2016-07-17 ENCOUNTER — Ambulatory Visit (HOSPITAL_BASED_OUTPATIENT_CLINIC_OR_DEPARTMENT_OTHER)
Admission: RE | Admit: 2016-07-17 | Discharge: 2016-07-17 | Disposition: A | Discharge status: Home / Self Care | Payer: 59 | Source: Ambulatory Visit | Attending: Podiatry | Admitting: Podiatry

## 2016-07-17 ENCOUNTER — Ambulatory Visit (INDEPENDENT_AMBULATORY_CARE_PROVIDER_SITE_OTHER): Payer: 59 | Admitting: Podiatry

## 2016-07-17 VITALS — BP 158/90 | HR 84 | Resp 18

## 2016-07-17 DIAGNOSIS — R52 Pain, unspecified: Secondary | ICD-10-CM

## 2016-07-17 DIAGNOSIS — M722 Plantar fascial fibromatosis: Secondary | ICD-10-CM

## 2016-07-17 DIAGNOSIS — M7731 Calcaneal spur, right foot: Secondary | ICD-10-CM | POA: Insufficient documentation

## 2016-07-17 DIAGNOSIS — M19071 Primary osteoarthritis, right ankle and foot: Secondary | ICD-10-CM | POA: Diagnosis not present

## 2016-07-17 NOTE — Progress Notes (Signed)
   Subjective:    Patient ID: Willie Atkins, male    DOB: 1961/07/11, 55 y.o.   MRN: [id number]  HPI  55 year old male presents the office today for concerns of right foot pain which has been ongoing for about 2 months and is sore in the morning when he first gets up and did a lot of walking. He has been taking meloxicam which is prescribed by primary care physician. Denies any recent injury or trauma. No spine or redness. Numbness or tingling. No other complaints at this time.  Review of Systems  All other systems reviewed and are negative.      Objective:   Physical Exam General: AAO x3, NAD  Dermatological: Skin is warm, dry and supple bilateral. Nails x 10 are well manicured; remaining integument appears unremarkable at this time. There are no open sores, no preulcerative lesions, no rash or signs of infection present.  Vascular: Dorsalis Pedis artery and Posterior Tibial artery pedal pulses are 2/4 bilateral with immedate capillary fill time. Pedal hair growth present. No varicosities and no lower extremity edema present bilateral. There is no pain with calf compression, swelling, warmth, erythema.   Neruologic: Grossly intact via light touch bilateral. Vibratory intact via tuning fork bilateral. Protective threshold with Semmes Wienstein monofilament intact to all pedal sites bilateral. Patellar and Achilles deep tendon reflexes 2+ bilateral. No Babinski or clonus noted bilateral.   Musculoskeletal: Tenderness to palpation along the plantar medial tubercle of the calcaneus at the insertion of plantar fascia on the right foot. There is no pain along the course of the plantar fascia within the arch of the foot. Plantar fascia appears to be intact. There is no pain with lateral compression of the calcaneus or pain with vibratory sensation. There is no pain along the course or insertion of the achilles tendon. No other areas of tenderness to bilateral lower extremities. MMT 5/5, ROM  WNL   Gait: Unassisted, Nonantalgic.      Assessment & Plan:  55 year old male presents for right heel pain, likely plantar fasciitis -Treatment options discussed including all alternatives, risks, and complications -Etiology of symptoms were discussed -X-ays ordered and reviewed. -Patient elects to proceed with steroid injection into the right heel. Under sterile skin preparation, a total of 2.5cc of kenalog 10, 0.5% Marcaine plain, and 2% lidocaine plain were infiltrated into the symptomatic area without complication. A band-aid was applied. Patient tolerated the injection well without complication. Post-injection care with discussed with the patient. Discussed with the patient to ice the area over the next couple of days to help prevent a steroid flare.  -Plantar fascial brace dispensed -Continue meloxicam -Ice and stretching exercises daily -Discussed shoe gear modifications and orthotics -Follow-up in 3 weeks or sooner if any problems arise. In the meantime, encouraged to call the office with any questions, concerns, change in symptoms.   Ovid Curd, DPM

## 2016-07-17 NOTE — Patient Instructions (Signed)

## 2016-08-01 ENCOUNTER — Ambulatory Visit (INDEPENDENT_AMBULATORY_CARE_PROVIDER_SITE_OTHER): Payer: 59 | Admitting: Family Medicine

## 2016-08-01 ENCOUNTER — Encounter: Payer: Self-pay | Admitting: Family Medicine

## 2016-08-01 VITALS — BP 132/84 | HR 86 | Temp 99.2°F | Resp 18 | Ht 74.0 in | Wt 299.0 lb

## 2016-08-01 DIAGNOSIS — M722 Plantar fascial fibromatosis: Secondary | ICD-10-CM | POA: Diagnosis not present

## 2016-08-01 DIAGNOSIS — E669 Obesity, unspecified: Secondary | ICD-10-CM | POA: Diagnosis not present

## 2016-08-01 DIAGNOSIS — M7662 Achilles tendinitis, left leg: Secondary | ICD-10-CM | POA: Diagnosis not present

## 2016-08-01 DIAGNOSIS — G4733 Obstructive sleep apnea (adult) (pediatric): Secondary | ICD-10-CM | POA: Diagnosis not present

## 2016-08-01 DIAGNOSIS — E8881 Metabolic syndrome: Secondary | ICD-10-CM

## 2016-08-01 DIAGNOSIS — G4700 Insomnia, unspecified: Secondary | ICD-10-CM

## 2016-08-01 DIAGNOSIS — F418 Other specified anxiety disorders: Secondary | ICD-10-CM

## 2016-08-01 DIAGNOSIS — Z9989 Dependence on other enabling machines and devices: Secondary | ICD-10-CM

## 2016-08-01 DIAGNOSIS — Z79899 Other long term (current) drug therapy: Secondary | ICD-10-CM

## 2016-08-01 DIAGNOSIS — E785 Hyperlipidemia, unspecified: Secondary | ICD-10-CM

## 2016-08-01 LAB — LIPID PANEL
Cholesterol: 144 mg/dL (ref 125–200)
HDL: 38 mg/dL — AB (ref >=40)
LDL Cholesterol: 81 mg/dL (ref <130)
TRIGLYCERIDES: 124 mg/dL (ref <150)
Total CHOL/HDL Ratio: 3.8 Ratio (ref <=5.0)
VLDL: 25 mg/dL (ref <30)

## 2016-08-01 LAB — COMPLETE METABOLIC PANEL WITH GFR
ALBUMIN: 4.2 g/dL (ref 3.6–5.1)
ALK PHOS: 48 U/L (ref 40–115)
ALT: 33 U/L (ref 9–46)
AST: 17 U/L (ref 10–35)
BUN: 25 mg/dL (ref 7–25)
CALCIUM: 9.2 mg/dL (ref 8.6–10.3)
CHLORIDE: 108 mmol/L (ref 98–110)
CO2: 22 mmol/L (ref 20–31)
CREATININE: 1.04 mg/dL (ref 0.70–1.33)
GFR, Est African American: >89 mL/min (ref >=60)
GFR, Est Non African American: 79 mL/min (ref >=60)
Glucose, Bld: 104 mg/dL — ABNORMAL HIGH (ref 65–99)
Potassium: 4.5 mmol/L (ref 3.5–5.3)
Sodium: 141 mmol/L (ref 135–146)
Total Bilirubin: 0.7 mg/dL (ref 0.2–1.2)
Total Protein: 7.1 g/dL (ref 6.1–8.1)

## 2016-08-01 LAB — HEMOGLOBIN A1C
Hgb A1c MFr Bld: 5.6 % (ref <5.7)
Mean Plasma Glucose: 114 mg/dL

## 2016-08-01 MED ORDER — CITALOPRAM HYDROBROMIDE 20 MG PO TABS: 20.0000 mg | ORAL_TABLET | Freq: Every day | ORAL | 1 refills | Status: DC

## 2016-08-01 MED ORDER — MELOXICAM 15 MG PO TABS: 15.0000 mg | ORAL_TABLET | Freq: Every day | ORAL | 1 refills | Status: DC

## 2016-08-01 MED ORDER — ALPRAZOLAM 0.5 MG PO TABS: 0.5000 mg | ORAL_TABLET | Freq: Every evening | ORAL | 2 refills | Status: DC | PRN

## 2016-08-01 NOTE — Progress Notes (Signed)
Name: Willie Atkins   MRN: [id number]    DOB: [birth year]   Date:08/01/2016       Progress Note  Subjective  Chief Complaint  Chief Complaint  Patient presents with  . Hyperlipidemia    pt here for 3 month follow up  . Anxiety    HPI  Dyslipidemia: he is now off Trilipix, triglycerides slightly up after stopped medication, but I do not recommend resuming it. Discussed ways to increase HDL level and decrease triglycerides. To improve HDL patient  needs to eat tree nuts ( pecans/pistachios/almonds ) four times weekly, eat fish two times weekly  and exercise  at least 150 minutes per week. He has not been walking lately because of his right plantar fascitis. He has gained weight.   OSA on CPAP: very complaint with CPAP machine, he wears it every night , all night, he notices a great improvement of his level of energy.   Metabolic Syndrome: Last hgbA1C 5.6% 07/2015. He denies polyphagia, polyuria or polydipsia. We will recheck labs today.  Depression/Anxiety: he is still taking Citalopram and alprazolam prn at night. He is still having marital counseling . Wife still unable to have intercourse. He denies suicide thoughts or ideation . He has a normal level of energy. He likes his job, he likes to go to [employer] meets, also works as Environmental consultantumpire for volleyball and baseball. [place]   Left achilles tendinitis: seen by Dr. Katrinka BlazingSmith, problem is present for the past few years, taking Meloxicam daily controls symptoms, however when he tries to wean self off pain returns.   Right plantar fascitis: he states pain started over 1 year ago, symptoms got progressively worse and pain was present during rest, went to Podiatrist this month and had a steroid injection and has been working on home stretching exercises. He states pain has improved, down to 2/10 at this time .   Patient Active Problem List   Diagnosis Date Noted  . Breast tenderness in male  08/31/2015  . Gynecomastia, male 08/31/2015  . Allergic rhinitis 08/15/2015  . Dysmetabolic syndrome 08/15/2015  . Mitral valve disorder 08/15/2015  . 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 Dyslipidemia 08/14/2015  . Obesity (BMI 30-39.9) 08/14/2015  . Hypogonadism male 08/14/2015  . Metabolic syndrome 08/14/2015  . Depression with anxiety 08/14/2015  . OSA on CPAP 08/14/2015  . S/P arthroscopic surgery of left knee 08/14/2015  . Abnormal hemoglobin (Hgb) (HCC) 08/14/2015  . Umbilical hernia without obstruction and without gangrene 08/14/2015  . Left Achilles tendinitis 08/14/2015    Past Surgical History:  Procedure Laterality Date  . KNEE SURGERY Left 08/2014  . VASECTOMY      Family History  Problem Relation Age of Onset  . Cancer Mother   . Heart disease Father   . Stroke Father     Social History   Social History  . Marital status: Married    Spouse name: N/A  . Number of children: N/A  . Years of education: N/A   Occupational History  . Not on file.   Social History Main Topics  . Smoking status: Never Smoker  . Smokeless tobacco: Never Used  . Alcohol use 4.2 oz/week    7 Standard drinks or equivalent per week     Comment: Wine with dinner nightly  . Drug use: No  . Sexual activity: No   Other Topics Concern  . Not on file   Social  History Narrative  . No narrative on file     Current Outpatient Prescriptions:  .  ALPRAZolam (XANAX) 0.5 MG tablet, Take 1 tablet (0.5 mg total) by mouth at bedtime as needed for anxiety., Disp: 30 tablet, Rfl: 2 .  aspirin 81 MG tablet, Take 81 mg by mouth daily., Disp: , Rfl:  .  citalopram (CELEXA) 20 MG tablet, Take 1 tablet (20 mg total) by mouth daily., Disp: 90 tablet, Rfl: 1 .  meloxicam (MOBIC) 15 MG tablet, Take 1 tablet (15 mg total) by mouth daily., Disp: 90 tablet, Rfl: 1  No Known Allergies   ROS  Constitutional: Negative for fever, positive for  weight change.  Respiratory: Negative for  cough and shortness of breath.   Cardiovascular: Negative for chest pain or palpitations.  Gastrointestinal: Negative for abdominal pain, no bowel changes.  Musculoskeletal: positive  for gait problem( secondary to plantar fascitis )  or joint swelling.  Skin: Negative for rash.  Neurological: Negative for dizziness or headache.  No other specific complaints in a complete review of systems (except as listed in HPI above).   Objective  Vitals:   08/01/16 0801  BP: 132/84  Pulse: 86  Resp: 18  Temp: 99.2 F (37.3 C)  SpO2: 96%  Weight: 299 lb (135.6 kg)  Height: 6\' 2"  (1.88 m)    Body mass index is 38.39 kg/m.  Physical Exam  Constitutional: Patient appears well-developed and well-nourished. Obese  No distress.  HEENT: head atraumatic, normocephalic, pupils equal and reactive to light,  neck supple, throat within normal limits Cardiovascular: Normal rate, regular rhythm and normal heart sounds.  No murmur heard. No BLE edema. Pulmonary/Chest: Effort normal and breath sounds normal. No respiratory distress. Abdominal: Soft.  There is no tenderness. Psychiatric: Patient has a normal mood and affect. behavior is normal. Judgment and thought content normal.  PHQ2/9: Depression screen [facility] 2/[facility] 08/01/2016 04/30/2016 12/19/2015 08/14/2015  Decreased Interest 0 0 0 0  Down, Depressed, Hopeless 0 0 0 0  PHQ - 2 Score 0 0 0 0     Fall Risk: Fall Risk  08/01/2016 04/30/2016 12/19/2015 08/14/2015  Falls in the past year? No No Yes No  Number falls in past yr: - - 1 -  Injury with Fall? - - No -     Functional Status Survey: Is the patient deaf or have difficulty hearing?: No Does the patient have difficulty seeing, even when wearing glasses/contacts?: No Does the patient have difficulty concentrating, remembering, or making decisions?: No Does the patient have difficulty walking or climbing stairs?: No Does the patient have difficulty dressing or bathing?: No Does the patient have  difficulty doing errands alone such as visiting a doctor's office or shopping?: No    Assessment & Plan  1. Metabolic syndrome  - Hemoglobin A1c  2. OSA on CPAP  Continue CPAP use  3. Dyslipidemia  - Lipid panel  4. Insomnia  - ALPRAZolam (XANAX) 0.5 MG tablet; Take 1 tablet (0.5 mg total) by mouth at bedtime as needed for anxiety.  Dispense: 30 tablet; Refill: 2  5. Obesity (BMI 30-39.9)  Discussed with the patient the risk posed by an increased BMI. Discussed importance of portion control, calorie counting and at least 150 minutes of physical activity weekly. Avoid sweet beverages and drink more water. Eat at least 6 servings of fruit and vegetables daily   6. Plantar fasciitis, right  - meloxicam (MOBIC) 15 MG tablet; Take 1 tablet (15 mg total) by mouth  daily.  Dispense: 90 tablet; Refill: 1  7. Left Achilles tendinitis  - meloxicam (MOBIC) 15 MG tablet; Take 1 tablet (15 mg total) by mouth daily.  Dispense: 90 tablet; Refill: 1  8. Depression with anxiety  - citalopram (CELEXA) 20 MG tablet; Take 1 tablet (20 mg total) by mouth daily.  Dispense: 90 tablet; Refill: 1  9. Encounter for long-term (current) use of high-risk medication  - COMPLETE METABOLIC PANEL WITH GFR

## 2016-08-14 ENCOUNTER — Ambulatory Visit (INDEPENDENT_AMBULATORY_CARE_PROVIDER_SITE_OTHER): Payer: 59 | Admitting: Podiatry

## 2016-08-14 ENCOUNTER — Encounter: Payer: Self-pay | Admitting: Podiatry

## 2016-08-14 DIAGNOSIS — M722 Plantar fascial fibromatosis: Secondary | ICD-10-CM | POA: Insufficient documentation

## 2016-08-15 NOTE — Progress Notes (Signed)
Subjective: Willie Atkins presents to the office today for follow-up evaluation of right heel pain. He states he is about 90% better but he does still get some mild pain. He isn't stretching icing regularly as well as wearing the plantar fascial brace. They have been icing, stretching, try to wear supportive shoe as much as possible. No other complaints at this time. No acute changes since last appointment. They deny any systemic complaints such as fevers, chills, nausea, vomiting.  Objective: General: AAO x3, NAD  Dermatological: Skin is warm, dry and supple bilateral. Nails x 10 are well manicured; remaining integument appears unremarkable at this time. There are no open sores, no preulcerative lesions, no rash or signs of infection present.  Vascular: Dorsalis Pedis artery and Posterior Tibial artery pedal pulses are 2/4 bilateral with immedate capillary fill time. Pedal hair growth present. There is no pain with calf compression, swelling, warmth, erythema.   Neruologic: Grossly intact via light touch bilateral. Vibratory intact via tuning fork bilateral. Protective threshold with Semmes Wienstein monofilament intact to all pedal sites bilateral.   Musculoskeletal: There is improved but continued tenderness palpation along the plantar medial tubercle of the calcaneus at the insertion of the plantar fascia on the right foot. There is no pain along the course of the plantar fascia within the arch of the foot. Plantar fascia appears to be intact bilaterally. There is no pain with lateral compression of the calcaneus and there is no pain with vibratory sensation. There is no pain along the course or insertion of the Achilles tendon. There are no other areas of tenderness to bilateral lower extremities. No gross boney pedal deformities bilateral. No pain, crepitus, or limitation noted with foot and ankle range of motion bilateral. Muscular strength 5/5 in all groups tested bilateral.  Gait: Unassisted,  Nonantalgic.   Assessment: Presents for follow-up evaluation for heel pain, likely plantar fasciitis   Plan: -Treatment options discussed including all alternatives, risks, and complications -Patient elects to proceed with steroid injection into the right heel. Under sterile skin preparation, a total of 2.5cc of kenalog 10, 0.5% Marcaine plain, and 2% lidocaine plain were infiltrated into the symptomatic area without complication. A band-aid was applied. Patient tolerated the injection well without complication. Post-injection care with discussed with the patient. Discussed with the patient to ice the area over the next couple of days to help prevent a steroid flare.  -Ice and stretching exercises on a daily basis. -Continue supportive shoe gear. -Follow-up in 4 weeks if symptoms continue or sooner if any problems arise. In the meantime, encouraged to call the office with any questions, concerns, change in symptoms.   Ovid CurdMatthew Wagoner, DPM

## 2016-09-10 ENCOUNTER — Encounter: Payer: Self-pay | Admitting: Podiatry

## 2016-09-10 ENCOUNTER — Telehealth: Payer: Self-pay | Admitting: *Deleted

## 2016-09-10 ENCOUNTER — Ambulatory Visit (INDEPENDENT_AMBULATORY_CARE_PROVIDER_SITE_OTHER): Payer: 59 | Admitting: Podiatry

## 2016-09-10 DIAGNOSIS — M722 Plantar fascial fibromatosis: Secondary | ICD-10-CM

## 2016-09-10 MED ORDER — METHYLPREDNISOLONE 4 MG PO TBPK: ORAL_TABLET | ORAL | 0 refills | Status: DC

## 2016-09-10 NOTE — Progress Notes (Signed)
Subjective: Willie Atkins presents to the office today for follow-up evaluation of right heel pain. He states that he is improving. He states that he has been icing the has not been as diligent with stretching. No numbness or tingling. No swelling. Pain does not wake him up at night.  No other complaints at this time. No acute changes since last appointment. They deny any systemic complaints such as fevers, chills, nausea, vomiting.  Objective: General: AAO x3, NAD  Dermatological: Skin is warm, dry and supple bilateral. Nails x 10 are well manicured; remaining integument appears unremarkable at this time. There are no open sores, no preulcerative lesions, no rash or signs of infection present.  Vascular: Dorsalis Pedis artery and Posterior Tibial artery pedal pulses are 2/4 bilateral with immedate capillary fill time. Pedal hair growth present. There is no pain with calf compression, swelling, warmth, erythema.   Neruologic: Grossly intact via light touch bilateral. Vibratory intact via tuning fork bilateral. Protective threshold with Semmes Wienstein monofilament intact to all pedal sites bilateral.   Musculoskeletal: There is improved yet continued tenderness palpation along the plantar medial tubercle of the calcaneus at the insertion of the plantar fascia on the right foot. There is no pain along the course of the plantar fascia within the arch of the foot. Plantar fascia appears to be intact bilaterally. There is no pain with lateral compression of the calcaneus and there is no pain with vibratory sensation. There is no pain along the course or insertion of the Achilles tendon. There are no other areas of tenderness to bilateral lower extremities. No gross boney pedal deformities bilateral. No pain, crepitus, or limitation noted with foot and ankle range of motion bilateral. Muscular strength 5/5 in all groups tested bilateral.  Gait: Unassisted, Nonantalgic.   Assessment: Presents for  follow-up evaluation for heel pain, likely plantar fasciitis   Plan: -Treatment options discussed including all alternatives, risks, and complications -Patient elects to proceed with steroid injection into the right heel. Under sterile skin preparation, a total of 2.5cc of kenalog 10, 0.5% Marcaine plain, and 2% lidocaine plain were infiltrated into the symptomatic area without complication. A band-aid was applied. Patient tolerated the injection well without complication. Post-injection care with discussed with the patient. Discussed with the patient to ice the area over the next couple of days to help prevent a steroid flare.  This is his third injection. -Ice and stretching exercises on a daily basis. -Medrol dose pack. Once this is complete can restart Mobic.  -Continue supportive shoe gear. -Follow-up in 4 weeks if symptoms continue or sooner if any problems arise. In the meantime, encouraged to call the office with any questions, concerns, change in symptoms.   Willie Atkins, DPM

## 2016-09-10 NOTE — Telephone Encounter (Signed)
Spoke to patient today about the custom inserts and patient has [employer] and they do not cover and the patient stated that he wanted to get them and I quoted $275.00 and he could pay half today and then half when picked up. Misty StanleyLisa

## 2016-10-01 ENCOUNTER — Encounter: Payer: Self-pay | Admitting: Podiatry

## 2016-10-01 ENCOUNTER — Ambulatory Visit (INDEPENDENT_AMBULATORY_CARE_PROVIDER_SITE_OTHER): Payer: 59 | Admitting: Podiatry

## 2016-10-01 DIAGNOSIS — M722 Plantar fascial fibromatosis: Secondary | ICD-10-CM

## 2016-10-01 NOTE — Patient Instructions (Signed)

## 2016-10-01 NOTE — Progress Notes (Signed)
Patient presents a pickup orthotics. He states that he is having no pain to his heel. He declined to be seen by myself. He was seen by the CMA [name](Kathy) for orthotic dispensing. Oral and written break in instructions were discussed. Follow-up in 4 weeks or sooner if needed.

## 2016-10-13 ENCOUNTER — Encounter: Payer: Self-pay | Admitting: Family Medicine

## 2016-11-04 ENCOUNTER — Encounter: Payer: Self-pay | Admitting: Podiatry

## 2016-11-28 ENCOUNTER — Telehealth: Payer: Self-pay | Admitting: *Deleted

## 2016-11-28 ENCOUNTER — Encounter: Payer: Self-pay | Admitting: Family Medicine

## 2016-11-28 ENCOUNTER — Ambulatory Visit (INDEPENDENT_AMBULATORY_CARE_PROVIDER_SITE_OTHER): Payer: 59 | Admitting: Family Medicine

## 2016-11-28 VITALS — BP 116/72 | HR 98 | Temp 99.3°F | Resp 18 | Ht 74.0 in | Wt 298.5 lb

## 2016-11-28 DIAGNOSIS — F418 Other specified anxiety disorders: Secondary | ICD-10-CM

## 2016-11-28 DIAGNOSIS — G4709 Other insomnia: Secondary | ICD-10-CM | POA: Diagnosis not present

## 2016-11-28 DIAGNOSIS — M722 Plantar fascial fibromatosis: Secondary | ICD-10-CM | POA: Diagnosis not present

## 2016-11-28 DIAGNOSIS — Z9989 Dependence on other enabling machines and devices: Secondary | ICD-10-CM

## 2016-11-28 DIAGNOSIS — G4733 Obstructive sleep apnea (adult) (pediatric): Secondary | ICD-10-CM | POA: Diagnosis not present

## 2016-11-28 DIAGNOSIS — E8881 Metabolic syndrome: Secondary | ICD-10-CM | POA: Diagnosis not present

## 2016-11-28 DIAGNOSIS — E785 Hyperlipidemia, unspecified: Secondary | ICD-10-CM

## 2016-11-28 DIAGNOSIS — Z23 Encounter for immunization: Secondary | ICD-10-CM | POA: Diagnosis not present

## 2016-11-28 MED ORDER — CITALOPRAM HYDROBROMIDE 20 MG PO TABS: 20.0000 mg | ORAL_TABLET | Freq: Every day | ORAL | 1 refills | Status: DC

## 2016-11-28 MED ORDER — ALPRAZOLAM 0.5 MG PO TABS: 0.5000 mg | ORAL_TABLET | Freq: Every evening | ORAL | 2 refills | Status: DC | PRN

## 2016-11-28 NOTE — Progress Notes (Signed)
Name: Willie Atkins   MRN: [id number]    DOB: Jan 11, 1961   Date:11/28/2016       Progress Note  Subjective  Chief Complaint  Chief Complaint  Patient presents with  . Medication Refill    4 month F/U  . Sleep Apnea    Doing well with CPAP  . Depression    Doing ok  . Dyslipidemia  . Plantar Fasciitis    When to Podiatrist and has insoles in shoes    HPI  Dyslipidemia: he is off Trilipix but triglycerides is back to normal.  Discussed ways to increase HDL level . To improve HDL he is eating  tree nuts ( pecans/pistachios/almonds ) four times weekly, but not eating fish as much or exercising as recommended.  OSA on CPAP: very complaint with CPAP machine, he wears it every night , all night, he notices a great improvement of his level of energy. He denies waking up with headache.   Metabolic Syndrome: Last hgbA1C 5.6% 07/2016 He denies polyphagia, polyuria or polydipsia.   Depression/Anxiety: he is still taking Citalopram and alprazolam prn at night. He is still having marital counseling, new counselor that is doing individual counseling and marital counseling . Wife still unable to have intercourse. He denies suicide thoughts or ideation . He has a normal level of energy. He likes his job, he likes to go to [employer] meets, also works as Environmental consultantumpire for volleyball and baseball.   Right plantar fascitis: he states pain started over 1 year ago, he sees Dr. Ardelle AntonWagoner, had a medrol dose pack and steroid injections and is doing well, no longer has pain   Patient Active Problem List   Diagnosis Date Noted  . Plantar fasciitis 08/14/2016  . Breast tenderness in male 08/31/2015  . Gynecomastia, male 08/31/2015  . Allergic rhinitis 08/15/2015  . Mitral valve disorder 08/15/2015  . 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 Dyslipidemia 08/14/2015  . Obesity (BMI 30-39.9) 08/14/2015  . Hypogonadism male 08/14/2015  . Metabolic syndrome 08/14/2015  . Depression with anxiety 08/14/2015   . OSA on CPAP 08/14/2015  . S/P arthroscopic surgery of left knee 08/14/2015  . Abnormal hemoglobin (Hgb) (HCC) 08/14/2015  . Umbilical hernia without obstruction and without gangrene 08/14/2015  . Left Achilles tendinitis 08/14/2015    Past Surgical History:  Procedure Laterality Date  . KNEE SURGERY Left 08/2014  . VASECTOMY      Family History  Problem Relation Age of Onset  . Cancer Mother   . Heart disease Father   . Stroke Father     Social History   Social History  . Marital status: Married    Spouse name: N/A  . Number of children: N/A  . Years of education: N/A   Occupational History  . Not on file.   Social History Main Topics  . Smoking status: Never Smoker  . Smokeless tobacco: Never Used  . Alcohol use 4.2 oz/week    7 Standard drinks or equivalent per week     Comment: Wine with dinner nightly  . Drug use: No  . Sexual activity: No   Other Topics Concern  . Not on file   Social History Narrative  . No narrative on file     Current Outpatient Prescriptions:  .  ALPRAZolam (XANAX) 0.5 MG tablet, Take 1 tablet (0.5 mg total) by mouth at bedtime as needed for anxiety., Disp: 30 tablet, Rfl: 2 .  aspirin 81 MG tablet, Take 81 mg by  mouth daily., Disp: , Rfl:  .  citalopram (CELEXA) 20 MG tablet, Take 1 tablet (20 mg total) by mouth daily., Disp: 90 tablet, Rfl: 1 .  meloxicam (MOBIC) 15 MG tablet, Take 1 tablet (15 mg total) by mouth daily., Disp: 90 tablet, Rfl: 1  No Known Allergies   ROS  Constitutional: Negative for fever or weight change.  Respiratory: Negative for cough and shortness of breath.   Cardiovascular: Negative for chest pain or palpitations.  Gastrointestinal: Negative for abdominal pain, no bowel changes.  Musculoskeletal: Negative for gait problem or joint swelling.  Skin: Negative for rash.  Neurological: Negative for dizziness or headache.  No other specific complaints in a complete review of systems (except as listed  in HPI above).  Objective  Vitals:   11/28/16 0826  BP: 116/72  Pulse: 98  Resp: 18  Temp: 99.3 F (37.4 C)  TempSrc: Oral  SpO2: 96%  Weight: 298 lb 8 oz (135.4 kg)  Height: 6\' 2"  (1.88 m)    Body mass index is 38.33 kg/m.  Physical Exam  Constitutional: Patient appears well-developed and well-nourished. Obese  No distress.  HEENT: head atraumatic, normocephalic, pupils equal and reactive to light, neck supple, throat within normal limits Cardiovascular: Normal rate, regular rhythm and normal heart sounds.  No murmur heard. No BLE edema. Pulmonary/Chest: Effort normal and breath sounds normal. No respiratory distress. Abdominal: Soft.  There is no tenderness. Psychiatric: Patient has a normal mood and affect. behavior is normal. Judgment and thought content normal.  PHQ2/9: [facility] screen [facility] 2/9 11/28/2016 08/01/2016 04/30/2016 12/19/2015 08/14/2015  Decreased Interest 0 0 0 0 0  Down, Depressed, Hopeless 0 0 0 0 0  PHQ - 2 Score 0 0 0 0 0     Fall Risk: Fall Risk  11/28/2016 08/01/2016 04/30/2016 12/19/2015 08/14/2015  Falls in the past year? No No No Yes No  Number falls in past yr: - - - 1 -  Injury with Fall? - - - No -     Functional Status Survey: Is the patient deaf or have difficulty hearing?: No Does the patient have difficulty seeing, even when wearing glasses/contacts?: No Does the patient have difficulty concentrating, remembering, or making decisions?: No Does the patient have difficulty walking or climbing stairs?: No Does the patient have difficulty dressing or bathing?: No Does the patient have difficulty doing errands alone such as visiting a doctor's office or shopping?: No    Assessment & Plan  1. OSA on CPAP  Doing well on CPAP   2. Need for diphtheria-tetanus-pertussis (Tdap) vaccine, adult/adolescent  - Tdap vaccine greater than or equal to 7yo IM  3. Metabolic syndrome  Discussed life style modification   4. Dyslipidemia  Doing  better, triglycerides is at goal  5. Other insomnia  - ALPRAZolam (XANAX) 0.5 MG tablet; Take 1 tablet (0.5 mg total) by mouth at bedtime as needed for anxiety.  Dispense: 30 tablet; Refill: 2  6. Depression with anxiety  - citalopram (CELEXA) 20 MG tablet; Take 1 tablet (20 mg total) by mouth daily.  Dispense: 90 tablet; Refill: 1  7. Plantar fasciitis, right  Continue follow up with Dr. Ardelle AntonWagoner

## 2016-11-28 NOTE — Telephone Encounter (Signed)
Pt states he would like to get another pair of orthotics prior to the end of the year.

## 2016-11-29 NOTE — Telephone Encounter (Signed)
Ordered 2nd pair of orthotics today. Will notify pt when they arrive.

## 2016-12-12 ENCOUNTER — Telehealth: Payer: Self-pay | Admitting: *Deleted

## 2016-12-12 NOTE — Telephone Encounter (Signed)
[facility] and the inserts are in production and will ship out either today or tomorrow and the patient was called and relayed the message and will call the patient when the inserts get here. Misty StanleyLisa

## 2017-01-06 DIAGNOSIS — Z719 Counseling, unspecified: Secondary | ICD-10-CM | POA: Diagnosis not present

## 2017-01-10 DIAGNOSIS — G4733 Obstructive sleep apnea (adult) (pediatric): Secondary | ICD-10-CM | POA: Diagnosis not present

## 2017-01-13 DIAGNOSIS — Z719 Counseling, unspecified: Secondary | ICD-10-CM | POA: Diagnosis not present

## 2017-01-23 DIAGNOSIS — Z719 Counseling, unspecified: Secondary | ICD-10-CM | POA: Diagnosis not present

## 2017-01-27 DIAGNOSIS — Z719 Counseling, unspecified: Secondary | ICD-10-CM | POA: Diagnosis not present

## 2017-02-03 DIAGNOSIS — Z719 Counseling, unspecified: Secondary | ICD-10-CM | POA: Diagnosis not present

## 2017-02-10 DIAGNOSIS — G4733 Obstructive sleep apnea (adult) (pediatric): Secondary | ICD-10-CM | POA: Diagnosis not present

## 2017-02-10 DIAGNOSIS — Z719 Counseling, unspecified: Secondary | ICD-10-CM | POA: Diagnosis not present

## 2017-02-17 DIAGNOSIS — Z719 Counseling, unspecified: Secondary | ICD-10-CM | POA: Diagnosis not present

## 2017-02-24 DIAGNOSIS — Z719 Counseling, unspecified: Secondary | ICD-10-CM | POA: Diagnosis not present

## 2017-03-06 DIAGNOSIS — Z719 Counseling, unspecified: Secondary | ICD-10-CM | POA: Diagnosis not present

## 2017-03-10 DIAGNOSIS — Z719 Counseling, unspecified: Secondary | ICD-10-CM | POA: Diagnosis not present

## 2017-03-15 DIAGNOSIS — G4733 Obstructive sleep apnea (adult) (pediatric): Secondary | ICD-10-CM | POA: Diagnosis not present

## 2017-03-17 DIAGNOSIS — Z719 Counseling, unspecified: Secondary | ICD-10-CM | POA: Diagnosis not present

## 2017-03-18 DIAGNOSIS — G4733 Obstructive sleep apnea (adult) (pediatric): Secondary | ICD-10-CM | POA: Diagnosis not present

## 2017-03-28 ENCOUNTER — Ambulatory Visit: Payer: 59 | Admitting: Family Medicine

## 2017-03-31 DIAGNOSIS — Z719 Counseling, unspecified: Secondary | ICD-10-CM | POA: Diagnosis not present

## 2017-04-07 DIAGNOSIS — Z719 Counseling, unspecified: Secondary | ICD-10-CM | POA: Diagnosis not present

## 2017-05-07 ENCOUNTER — Encounter: Payer: Self-pay | Admitting: Family Medicine

## 2017-05-07 ENCOUNTER — Ambulatory Visit (INDEPENDENT_AMBULATORY_CARE_PROVIDER_SITE_OTHER): Payer: 59 | Admitting: Family Medicine

## 2017-05-07 VITALS — BP 118/78 | HR 71 | Temp 98.3°F | Resp 16 | Ht 74.0 in | Wt 253.1 lb

## 2017-05-07 DIAGNOSIS — G4733 Obstructive sleep apnea (adult) (pediatric): Secondary | ICD-10-CM

## 2017-05-07 DIAGNOSIS — G4709 Other insomnia: Secondary | ICD-10-CM | POA: Diagnosis not present

## 2017-05-07 DIAGNOSIS — E669 Obesity, unspecified: Secondary | ICD-10-CM

## 2017-05-07 DIAGNOSIS — Z9989 Dependence on other enabling machines and devices: Secondary | ICD-10-CM

## 2017-05-07 DIAGNOSIS — R634 Abnormal weight loss: Secondary | ICD-10-CM

## 2017-05-07 DIAGNOSIS — E8881 Metabolic syndrome: Secondary | ICD-10-CM

## 2017-05-07 DIAGNOSIS — E785 Hyperlipidemia, unspecified: Secondary | ICD-10-CM | POA: Diagnosis not present

## 2017-05-07 LAB — COMPLETE METABOLIC PANEL WITH GFR
ALBUMIN: 4.3 g/dL (ref 3.6–5.1)
ALK PHOS: 65 U/L (ref 40–115)
ALT: 30 U/L (ref 9–46)
AST: 18 U/L (ref 10–35)
BILIRUBIN TOTAL: 1.1 mg/dL (ref 0.2–1.2)
BUN: 18 mg/dL (ref 7–25)
CALCIUM: 9.4 mg/dL (ref 8.6–10.3)
CO2: 25 mmol/L (ref 20–31)
Chloride: 106 mmol/L (ref 98–110)
Creat: 0.98 mg/dL (ref 0.70–1.33)
GFR, EST NON AFRICAN AMERICAN: 85 mL/min (ref >=60)
GFR, Est African American: >89 mL/min (ref >=60)
GLUCOSE: 91 mg/dL (ref 65–99)
Potassium: 4.4 mmol/L (ref 3.5–5.3)
SODIUM: 142 mmol/L (ref 135–146)
TOTAL PROTEIN: 6.9 g/dL (ref 6.1–8.1)

## 2017-05-07 LAB — CBC WITH DIFFERENTIAL/PLATELET
BASOS ABS: 0 {cells}/uL (ref 0–200)
Basophils Relative: 0 %
EOS ABS: 146 {cells}/uL (ref 15–500)
Eosinophils Relative: 2 %
HEMATOCRIT: 46.5 % (ref 38.5–50.0)
Hemoglobin: 15.5 g/dL (ref 13.2–17.1)
Lymphocytes Relative: 39 %
Lymphs Abs: 2847 cells/uL (ref 850–3900)
MCH: 28.9 pg (ref 27.0–33.0)
MCHC: 33.3 g/dL (ref 32.0–36.0)
MCV: 86.6 fL (ref 80.0–100.0)
MONO ABS: 511 {cells}/uL (ref 200–950)
MPV: 10.2 fL (ref 7.5–12.5)
Monocytes Relative: 7 %
NEUTROS ABS: 3796 {cells}/uL (ref 1500–7800)
Neutrophils Relative %: 52 %
Platelets: 191 10*3/uL (ref 140–400)
RBC: 5.37 MIL/uL (ref 4.20–5.80)
RDW: 14.3 % (ref 11.0–15.0)
WBC: 7.3 10*3/uL (ref 3.8–10.8)

## 2017-05-07 LAB — LIPID PANEL
CHOLESTEROL: 143 mg/dL (ref <200)
HDL: 38 mg/dL — ABNORMAL LOW (ref >40)
LDL Cholesterol: 83 mg/dL (ref <100)
TRIGLYCERIDES: 111 mg/dL (ref <150)
Total CHOL/HDL Ratio: 3.8 Ratio (ref <5.0)
VLDL: 22 mg/dL (ref <30)

## 2017-05-07 MED ORDER — ALPRAZOLAM 0.5 MG PO TABS: 0.5000 mg | ORAL_TABLET | Freq: Every evening | ORAL | 2 refills | Status: DC | PRN

## 2017-05-07 NOTE — Progress Notes (Signed)
Name: Willie Atkins   MRN: [id number]    DOB: December 28, 1960   Date:05/07/2017       Progress Note  Subjective  Chief Complaint  Chief Complaint  Patient presents with  . Medication Refill    4 month F/U  . OSA on CPAP    States his sleep has been fair  . Dyslipidemia  . Metabolic Syndrome    Has lost 46 pounds since last visit with Calorie Management, eating 1800 calories daily  . Depression    Fair    HPI   Obesity: he is doing great, he started a weight loss program at work back in Dec 2017, run-walking daily for about 40 minutes, he is eating healthier at most 1800 calories daily. He has weekly sessions with health coach, he is very motivated, based on his scale he has lost 53 lbs since Dec 2017 - Starting weight of 300 lbs.   Dyslipidemia: he is off medication, on life style modification and wants to have labs done  OSA on CPAP: very complaint with CPAP machine, he wears it every night , all night, he notices a great improvement of his level of energy. He denies waking up with headache. Pressure down from 13 to 11 since he lost 50 lbs  Metabolic Syndrome: Last hgbA1C 5.6% 07/2016 He denies polyphagia, polyuria or polydipsia. He has been eating much healthier and is losing weight  Depression/Anxiety: he took Citalopram for many years, but stopped it on his own January 2018 because he did not know if it was working ( no changes in mood since) takes  alprazolam prn at night for sleep.Marland Kitchen. He is still having marital counseling, new counselor that is doing individual counseling and marital counseling . Wife still unable to have intercourse. He denies suicide thoughts or ideation . He has a normal level of energy. He likes his job, he likes to go to [employer] meets, also works as Environmental consultantumpire for volleyball and baseball.   Patient Active Problem List   Diagnosis Date Noted  . Plantar fasciitis 08/14/2016  . Breast tenderness in male 08/31/2015  . Gynecomastia, male 08/31/2015  . Allergic  rhinitis 08/15/2015  . Mitral valve disorder 08/15/2015  . 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 Dyslipidemia 08/14/2015  . Obesity (BMI 30-39.9) 08/14/2015  . Hypogonadism male 08/14/2015  . Metabolic syndrome 08/14/2015  . Depression with anxiety 08/14/2015  . OSA on CPAP 08/14/2015  . S/P arthroscopic surgery of left knee 08/14/2015  . Abnormal hemoglobin (Hgb) (HCC) 08/14/2015  . Umbilical hernia without obstruction and without gangrene 08/14/2015  . Left Achilles tendinitis 08/14/2015    Past Surgical History:  Procedure Laterality Date  . KNEE SURGERY Left 08/2014  . VASECTOMY      Family History  Problem Relation Age of Onset  . Cancer Mother   . Heart disease Father   . Stroke Father     Social History   Social History  . Marital status: Married    Spouse name: N/A  . Number of children: N/A  . Years of education: N/A   Occupational History  . Not on file.   Social History Main Topics  . Smoking status: Never Smoker  . Smokeless tobacco: Never Used  . Alcohol use 4.2 oz/week    7 Standard drinks or equivalent per week     Comment: Wine with dinner nightly  . Drug use: No  . Sexual activity: No   Other Topics Concern  . Not on  file   Social History Narrative  . No narrative on file     Current Outpatient Prescriptions:  .  ALPRAZolam (XANAX) 0.5 MG tablet, Take 1 tablet (0.5 mg total) by mouth at bedtime as needed for anxiety., Disp: 30 tablet, Rfl: 2 .  aspirin 81 MG tablet, Take 81 mg by mouth daily., Disp: , Rfl:   No Known Allergies   ROS  Constitutional: Negative for fever, positive for weight change ( loss )   Respiratory: Negative for cough and shortness of breath.   Cardiovascular: Negative for chest pain or palpitations.  Gastrointestinal: Negative for abdominal pain, no bowel changes.  Musculoskeletal: Negative for gait problem or joint swelling.  Skin: Negative for rash.  Neurological: Negative for dizziness or headache.   No other specific complaints in a complete review of systems (except as listed in HPI above).  Objective  Vitals:   05/07/17 0859  BP: 118/78  Pulse: 71  Resp: 16  Temp: 98.3 F (36.8 C)  TempSrc: Oral  SpO2: 98%  Weight: 253 lb 1.6 oz (114.8 kg)  Height: 6\' 2"  (1.88 m)    Body mass index is 32.5 kg/m.  Physical Exam  Constitutional: Patient appears well-developed and well-nourished. Obese  No distress.  HEENT: head atraumatic, normocephalic, pupils equal and reactive to light,  neck supple, throat within normal limits Cardiovascular: Normal rate, regular rhythm and normal heart sounds.  No murmur heard. No BLE edema. Pulmonary/Chest: Effort normal and breath sounds normal. No respiratory distress. Abdominal: Soft.  There is no tenderness. Psychiatric: Patient has a normal mood and affect. behavior is normal. Judgment and thought content normal.  PHQ2/9: Depression screen [facility] 2/9 05/07/2017 11/28/2016 08/01/2016 04/30/2016 12/19/2015  Decreased Interest 0 0 0 0 0  Down, Depressed, Hopeless 0 0 0 0 0  PHQ - 2 Score 0 0 0 0 0    Fall Risk: Fall Risk  05/07/2017 11/28/2016 08/01/2016 04/30/2016 12/19/2015  Falls in the past year? No No No No Yes  Number falls in past yr: - - - - 1  Injury with Fall? - - - - No    Functional Status Survey: Is the patient deaf or have difficulty hearing?: No Does the patient have difficulty seeing, even when wearing glasses/contacts?: No Does the patient have difficulty concentrating, remembering, or making decisions?: No Does the patient have difficulty walking or climbing stairs?: No Does the patient have difficulty dressing or bathing?: No Does the patient have difficulty doing errands alone such as visiting a doctor's office or shopping?: No    Assessment & Plan  1. Metabolic syndrome  He has changed his diet and has lost 47 lbs ( 53 lbs based on his scale) - Hemoglobin A1c - Insulin, fasting  2. OSA on CPAP  His pressure has  decreased pressure from 13 cmH2O to 11   3. Dyslipidemia  - Lipid panel  4. Other insomnia  - ALPRAZolam (XANAX) 0.5 MG tablet; Take 1 tablet (0.5 mg total) by mouth at bedtime as needed for anxiety.  Dispense: 30 tablet; Refill: 2  5. Obesity (BMI 30-39.9)  Losing weight, doing great, he has a Control and instrumentation engineer through work   6. Weight loss  Likely from life style modification  - COMPLETE METABOLIC PANEL WITH GFR - CBC with Differential/Platelet - TSH - Vitamin B12 - VITAMIN D 25 Hydroxy (Vit-D Deficiency, Fractures)

## 2017-05-08 LAB — VITAMIN B12: Vitamin B-12: 245 pg/mL (ref 200–1100)

## 2017-05-08 LAB — HEMOGLOBIN A1C
Hgb A1c MFr Bld: 5 % (ref <5.7)
Mean Plasma Glucose: 97 mg/dL

## 2017-05-08 LAB — VITAMIN D 25 HYDROXY (VIT D DEFICIENCY, FRACTURES): Vit D, 25-Hydroxy: 40 ng/mL (ref 30–100)

## 2017-05-08 LAB — INSULIN, FASTING: Insulin fasting, serum: 11.2 u[IU]/mL (ref 2.0–19.6)

## 2017-05-08 LAB — TSH: TSH: 2.84 m[IU]/L (ref 0.40–4.50)

## 2017-06-17 DIAGNOSIS — G4733 Obstructive sleep apnea (adult) (pediatric): Secondary | ICD-10-CM | POA: Diagnosis not present

## 2017-08-14 ENCOUNTER — Encounter: Payer: Self-pay | Admitting: Family Medicine

## 2017-09-16 DIAGNOSIS — Z23 Encounter for immunization: Secondary | ICD-10-CM | POA: Diagnosis not present

## 2017-09-17 DIAGNOSIS — G4733 Obstructive sleep apnea (adult) (pediatric): Secondary | ICD-10-CM | POA: Diagnosis not present

## 2017-10-01 DIAGNOSIS — G4733 Obstructive sleep apnea (adult) (pediatric): Secondary | ICD-10-CM | POA: Diagnosis not present

## 2017-10-15 ENCOUNTER — Ambulatory Visit: Payer: 59 | Admitting: Family Medicine

## 2017-11-03 ENCOUNTER — Ambulatory Visit (INDEPENDENT_AMBULATORY_CARE_PROVIDER_SITE_OTHER): Payer: 59 | Admitting: Family Medicine

## 2017-11-03 ENCOUNTER — Encounter: Payer: Self-pay | Admitting: Family Medicine

## 2017-11-03 VITALS — BP 110/80 | HR 77 | Resp 12 | Ht 74.0 in | Wt 218.5 lb

## 2017-11-03 DIAGNOSIS — E785 Hyperlipidemia, unspecified: Secondary | ICD-10-CM

## 2017-11-03 DIAGNOSIS — R634 Abnormal weight loss: Secondary | ICD-10-CM

## 2017-11-03 DIAGNOSIS — G4709 Other insomnia: Secondary | ICD-10-CM | POA: Diagnosis not present

## 2017-11-03 DIAGNOSIS — G4733 Obstructive sleep apnea (adult) (pediatric): Secondary | ICD-10-CM | POA: Diagnosis not present

## 2017-11-03 DIAGNOSIS — F419 Anxiety disorder, unspecified: Secondary | ICD-10-CM

## 2017-11-03 DIAGNOSIS — E538 Deficiency of other specified B group vitamins: Secondary | ICD-10-CM

## 2017-11-03 DIAGNOSIS — Z9989 Dependence on other enabling machines and devices: Secondary | ICD-10-CM

## 2017-11-03 DIAGNOSIS — E663 Overweight: Secondary | ICD-10-CM

## 2017-11-03 MED ORDER — ALPRAZOLAM 0.5 MG PO TABS: 0.5000 mg | ORAL_TABLET | Freq: Every evening | ORAL | 2 refills | Status: DC | PRN

## 2017-11-03 NOTE — Progress Notes (Signed)
Name: Willie Atkins   MRN: [id number]    DOB: 05-Jul-1961   Date:11/03/2017       Progress Note  Subjective  Chief Complaint  Chief Complaint  Patient presents with  . Sleep Apnea    HPI  Obesity: he is doing great, he started a weight loss program at work back in Dec 2017, walking daily for about 30- 40 minutes, he is eating healthier at most 1800 calories daily. He has weekly sessions with health coach, he is very motivated, based on his scale he has lost 90bs ( 82 lbs in our scale)   lbs since Dec 2017 - Starting weight of 300 lbs.   Dyslipidemia: he is off medication, on life style modification, reviewed last labs with him. It was at goal  OSA on CPAP: very complaint with CPAP machine, he wears it every night , all night, he notices a great improvement of his level of energy. He denies waking up with headache. Pressure down from 13 to 10- 11 since weight loss.   Metabolic Syndrome: Last hgbA1C down to 5% back in May 2018 He denies polyphagia, polyuria or polydipsia. He has been eating much healthier and is losing weight   Depression/Anxiety: he took Citalopram for many years, but stopped it on his own January 2018 because he did not know if it was working ( no changes in mood since) takes  alprazolam prn at night for sleep and weaning self off. Marland Kitchen. He is still having marital counseling, new counselor that is doing individual counseling and marital counseling. Wife still unable to have intercourse. He denies suicide thoughts or ideation . He has a normal level of energy. He likes his job, he likes to go to [employer] meets, also works as Environmental consultantumpire for volleyball and baseball. He feels well. Excited about seeing grand-daughter this Summer    Patient Active Problem List   Diagnosis Date Noted  . Plantar fasciitis 08/14/2016  . Breast tenderness in male 08/31/2015  . Gynecomastia, male 08/31/2015  . Allergic rhinitis 08/15/2015  . Mitral valve disorder 08/15/2015  . Chronic meniscal  tear of knee 08/15/2015  . Dyslipidemia 08/14/2015  . Hypogonadism male 08/14/2015  . Metabolic syndrome 08/14/2015  . Depression with anxiety 08/14/2015  . OSA on CPAP 08/14/2015  . S/P arthroscopic surgery of left knee 08/14/2015  . Umbilical hernia without obstruction and without gangrene 08/14/2015  . Left Achilles tendinitis 08/14/2015    Past Surgical History:  Procedure Laterality Date  . KNEE SURGERY Left 08/2014  . VASECTOMY      Family History  Problem Relation Age of Onset  . Cancer Mother   . Heart disease Father   . Stroke Father     Social History   Socioeconomic History  . Marital status: Married    Spouse name: Not on file  . Number of children: Not on file  . Years of education: Not on file  . Highest education level: Not on file  Social Needs  . Financial resource strain: Not on file  . Food insecurity - worry: Not on file  . Food insecurity - inability: Not on file  . Transportation needs - medical: Not on file  . Transportation needs - non-medical: Not on file  Occupational History  . Not on file  Tobacco Use  . Smoking status: Never Smoker  . Smokeless tobacco: Never Used  Substance and Sexual Activity  . Alcohol use: Yes    Alcohol/week: 4.2 oz    Types:  7 Standard drinks or equivalent per week    Comment: Wine with dinner nightly  . Drug use: No  . Sexual activity: No  Other Topics Concern  . Not on file  Social History Narrative  . Not on file     Current Outpatient Medications:  .  ALPRAZolam (XANAX) 0.5 MG tablet, Take 1 tablet (0.5 mg total) at bedtime as needed by mouth for anxiety., Disp: 30 tablet, Rfl: 2 .  aspirin 81 MG tablet, Take 81 mg by mouth daily., Disp: , Rfl:  .  Multiple Vitamin (MULTIVITAMIN WITH MINERALS) TABS tablet, Take 1 tablet daily by mouth., Disp: , Rfl:   No Known Allergies   ROS  Constitutional: Negative for fever, positive for  weight change.  Respiratory: Negative for cough and shortness of  breath.   Cardiovascular: Negative for chest pain or palpitations.  Gastrointestinal: Negative for abdominal pain, no bowel changes.  Musculoskeletal: Negative for gait problem or joint swelling.  Skin: Negative for rash.  Neurological: Negative for dizziness or headache.  No other specific complaints in a complete review of systems (except as listed in HPI above).  Objective  Vitals:   11/03/17 1016  BP: 110/80  Pulse: 77  Resp: 12  SpO2: 99%  Weight: 218 lb 8 oz (99.1 kg)  Height: 6\' 2"  (1.88 m)    Body mass index is 28.05 kg/m.  Physical Exam  Constitutional: Patient appears well-developed and well-nourished. Overweight.  No distress.  HEENT: head atraumatic, normocephalic, pupils equal and reactive to light, neck supple, throat within normal limits Cardiovascular: Normal rate, regular rhythm and normal heart sounds.  No murmur heard. No BLE edema. Pulmonary/Chest: Effort normal and breath sounds normal. No respiratory distress. Abdominal: Soft.  There is no tenderness. Psychiatric: Patient has a normal mood and affect. behavior is normal. Judgment and thought content normal.  PHQ2/9: Depression screen [facility] 2/9 05/07/2017 11/28/2016 08/01/2016 04/30/2016 12/19/2015  Decreased Interest 0 0 0 0 0  Down, Depressed, Hopeless 0 0 0 0 0  PHQ - 2 Score 0 0 0 0 0     Fall Risk: Fall Risk  11/03/2017 05/07/2017 11/28/2016 08/01/2016 04/30/2016  Falls in the past year? No No No No No  Number falls in past yr: - - - - -  Injury with Fall? - - - - -     Functional Status Survey: Is the patient deaf or have difficulty hearing?: No Does the patient have difficulty seeing, even when wearing glasses/contacts?: No Does the patient have difficulty concentrating, remembering, or making decisions?: No Does the patient have difficulty walking or climbing stairs?: No Does the patient have difficulty dressing or bathing?: No Does the patient have difficulty doing errands alone such as  visiting a doctor's office or shopping?: No   Assessment & Plan  1. OSA on CPAP  Pressure is going down because of weight loss  2. B12 deficiency  Needs to take SL daily   3. Dyslipidemia  He is on life style modification only   4. Other insomnia  He is weaning self off, he wants to try coming here in 6 months  - ALPRAZolam (XANAX) 0.5 MG tablet; Take 1 tablet (0.5 mg total) at bedtime as needed by mouth for anxiety.  Dispense: 30 tablet; Refill: 2  5. Anxiety  Doing much better, off SSRI  6. Weight loss  On a weight loss program at work, he states he feels well Discussed that as a doctor it is a red flag  the amount of weight loss and offered to check more labs ( HIV, TSH, PSA and CXR, c-reactive and sed rate). He would like to hold off for now.  7. Overweight (BMI 25.0-29.9)  He achieved his goal

## 2018-02-04 ENCOUNTER — Ambulatory Visit (INDEPENDENT_AMBULATORY_CARE_PROVIDER_SITE_OTHER): Payer: 59 | Admitting: Family Medicine

## 2018-02-04 ENCOUNTER — Encounter: Payer: Self-pay | Admitting: Family Medicine

## 2018-02-04 VITALS — BP 110/60 | HR 78 | Temp 98.0°F | Resp 14 | Ht 73.23 in | Wt 222.3 lb

## 2018-02-04 DIAGNOSIS — Z0000 Encounter for general adult medical examination without abnormal findings: Secondary | ICD-10-CM

## 2018-02-04 NOTE — Patient Instructions (Signed)

## 2018-02-04 NOTE — Progress Notes (Signed)
Name: Willie Atkins   MRN: [id number]    DOB: 12-06-1961   Date:02/04/2018       Progress Note  Subjective  Chief Complaint  Chief Complaint  Patient presents with  . Annual Exam    HPI  Patient presents for annual CPE  USPSTF grade A and B recommendations:  Diet: very healthy diet, weight loss started 2018, lost 90lbs, but stable now and wants to keep weight between 210 -220 lbs Exercise: he was advised physical activity   Depression:  [facility] screen [facility] 2[facility]9 02/04/2018 05/07/2017 11/28/2016 08/01/2016 04/30/2016  Decreased Interest 0 0 0 0 0  Down, Depressed, Hopeless 1 0 0 0 0  PHQ - 2 Score 1 0 0 0 0    Hypertension:  BP Readings from Last 3 Encounters:  02/04/18 110/60  11/03/17 110/80  05/07/17 118/78    Obesity: Wt Readings from Last 3 Encounters:  02/04/18 222 lb 4.8 oz (100.8 kg)  11/03/17 218 lb 8 oz (99.1 kg)  05/07/17 253 lb 1.6 oz (114.8 kg)   BMI Readings from Last 3 Encounters:  02/04/18 29.15 kg/m  11/03/17 28.05 kg/m  05/07/17 32.50 kg/m     Lipids:  Lab Results  Component Value Date   CHOL 143 05/07/2017   CHOL 144 08/01/2016   CHOL 163 08/14/2015   Lab Results  Component Value Date   HDL 38 (L) 05/07/2017   HDL 38 (L) 08/01/2016   HDL 37 (L) 08/14/2015   Lab Results  Component Value Date   LDLCALC 83 05/07/2017   LDLCALC 81 08/01/2016   LDLCALC 94 08/14/2015   Lab Results  Component Value Date   TRIG 111 05/07/2017   TRIG 124 08/01/2016   TRIG 159 (H) 08/14/2015   Lab Results  Component Value Date   CHOLHDL 3.8 05/07/2017   CHOLHDL 3.8 08/01/2016   CHOLHDL 4.4 08/14/2015   No results found for: LDLDIRECT Glucose:  Glucose  Date Value Ref Range Status  08/14/2015 100 (H) 65 - 99 mg/dL Final   Glucose, Bld  Date Value Ref Range Status  05/07/2017 91 65 - 99 mg/dL Final  40/98/119108/17/2017 478104 (H) 65 - 99 mg/dL Final    Alcohol: discussed importance of not drinking to treat anxiety, stay at most 2 servings per day, try  to go down to one serving per day  Tobacco use: never smoked  Married STD testing and prevention (chl/gon/syphilis): not interested  HIV, hep C: not interested on HIV, hepatitis C negative   Skin cancer: no changes  Colorectal cancer: 2011 Prostate cancer: discussed USPTF Lab Results  Component Value Date   PSA 0.5 normal 01/25/2014    IPSS Questionnaire (AUA-7): Over the past month.   1)  How often have you had a sensation of not emptying your bladder completely after you finish urinating?  0 - Not at all  2)  How often have you had to urinate again less than two hours after you finished urinating? 2 - Less than half the time  3)  How often have you found you stopped and started again several times when you urinated?  0 - Not at all  4) How difficult have you found it to postpone urination?  3 - About half the time  5) How often have you had a weak urinary stream?  0 - Not at all  6) How often have you had to push or strain to begin urination?  0 - Not at all  7) How  many times did you most typically get up to urinate from the time you went to bed until the time you got up in the morning?  2 - 2 times  Total score:  0-7 mildly symptomatic   8-19 moderately symptomatic   20-35 severely symptomatic   Lung cancer:  Low Dose CT Chest recommended if Age 61-80 years, 30 pack-year currently smoking OR have quit w/in 15years. Patient does not qualify.   AAA: he does not qualify   Aspirin: yes ECG:  2015  Advanced Care Planning: A voluntary discussion about advance care planning including the explanation and discussion of advance directives.  Discussed health care proxy and Living will, and the patient was able to identify a health care proxy as Nicholos Johns - wife   Patient does not have a living will at present time. If patient does have living will, I have requested they bring this to the clinic to be scanned in to their chart.  Patient Active Problem List   Diagnosis Date Noted  .  Plantar fasciitis 08/14/2016  . Breast tenderness in male 08/31/2015  . Gynecomastia, male 08/31/2015  . Allergic rhinitis 08/15/2015  . Mitral valve disorder 08/15/2015  . 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 Dyslipidemia 08/14/2015  . Hypogonadism male 08/14/2015  . Metabolic syndrome 08/14/2015  . Depression with anxiety 08/14/2015  . OSA on CPAP 08/14/2015  . S/P arthroscopic surgery of left knee 08/14/2015  . Umbilical hernia without obstruction and without gangrene 08/14/2015  . Left Achilles tendinitis 08/14/2015    Past Surgical History:  Procedure Laterality Date  . KNEE SURGERY Left 08/2014  . VASECTOMY      Family History  Problem Relation Age of Onset  . Cancer Mother   . Heart disease Father   . Stroke Father     Social History   Socioeconomic History  . Marital status: Married    Spouse name: [name]   . Number of children: 2  . Years of education: Not on file  . Highest education level: Associate degree: academic program  Social Needs  . Financial resource strain: Not hard at all  . Food insecurity - worry: Never true  . Food insecurity - inability: Never true  . Transportation needs - medical: No  . Transportation needs - non-medical: No  Occupational History  . Occupation: Production assistant, radio   Tobacco Use  . Smoking status: Never Smoker  . Smokeless tobacco: Never Used  Substance and Sexual Activity  . Alcohol use: Yes    Alcohol/week: 1.2 oz    Types: 2 Glasses of wine per week    Comment: Wine with dinner nightly  . Drug use: No  . Sexual activity: No  Other Topics Concern  . Not on file  Social History Narrative  . Not on file     Current Outpatient Medications:  .  ALPRAZolam (XANAX) 0.5 MG tablet, Take 1 tablet (0.5 mg total) at bedtime as needed by mouth for anxiety., Disp: 30 tablet, Rfl: 2 .  aspirin 81 MG tablet, Take 81 mg by mouth daily., Disp: , Rfl:  .  cyanocobalamin 1000 MCG tablet, Take 1,000 mcg by mouth  daily., Disp: , Rfl:  .  Multiple Vitamin (MULTIVITAMIN WITH MINERALS) TABS tablet, Take 1 tablet daily by mouth., Disp: , Rfl:   No Known Allergies   ROS   Constitutional: Negative for fever or weight change.  Respiratory: Negative for cough and shortness of breath.   Cardiovascular: Negative for chest  pain or palpitations.  Gastrointestinal: Negative for abdominal pain, no bowel changes.  Musculoskeletal: Negative for gait problem or joint swelling.  Skin: Negative for rash.  Neurological: Negative for dizziness or headache.  No other specific complaints in a complete review of systems (except as listed in HPI above).   Objective  Vitals:   02/04/18 0811  BP: 110/60  Pulse: 78  Resp: 14  Temp: 98 F (36.7 C)  TempSrc: Oral  SpO2: 98%  Weight: 222 lb 4.8 oz (100.8 kg)  Height: 6' 1.23" (1.86 m)    Body mass index is 29.15 kg/m.  Physical Exam  Constitutional: Patient appears well-developed and well-nourished. No distress.  HENT: Head: Normocephalic and atraumatic. Ears: B TMs ok, no erythema or effusion; Nose: Nose normal. Mouth/Throat: Oropharynx is clear and moist. No oropharyngeal exudate.  Eyes: Conjunctivae and EOM are normal. Pupils are equal, round, and reactive to light. No scleral icterus.  Neck: Normal range of motion. Neck supple. No JVD present. No thyromegaly present.  Cardiovascular: Normal rate, regular rhythm and normal heart sounds.  No murmur heard. No BLE edema. Pulmonary/Chest: Effort normal and breath sounds normal. No respiratory distress. Abdominal: Soft. Bowel sounds are normal, no distension. There is no tenderness. no masses MALE GENITALIA: Normal descended testes bilaterally, no masses palpated, no hernias, no lesions, no discharge RECTAL: normal rectal exam Musculoskeletal: Normal range of motion, no joint effusions. No gross deformities Neurological: he is alert and oriented to person, place, and time. No cranial nerve deficit.  Coordination, balance, strength, speech and gait are normal.  Skin: Skin is warm and dry. No rash noted. No erythema.  Psychiatric: Patient has a normal mood and affect. behavior is normal. Judgment and thought content normal.    PHQ2/9: Depression screen [facility] 2/9 02/04/2018 05/07/2017 11/28/2016 08/01/2016 04/30/2016  Decreased Interest 0 0 0 0 0  Down, Depressed, Hopeless 1 0 0 0 0  PHQ - 2 Score 1 0 0 0 0     Fall Risk: Fall Risk  02/04/2018 11/03/2017 05/07/2017 11/28/2016 08/01/2016  Falls in the past year? No No No No No  Number falls in past yr: - - - - -  Injury with Fall? - - - - -     Functional Status Survey: Is the patient deaf or have difficulty hearing?: No Does the patient have difficulty seeing, even when wearing glasses/contacts?: No Does the patient have difficulty concentrating, remembering, or making decisions?: No Does the patient have difficulty walking or climbing stairs?: No Does the patient have difficulty dressing or bathing?: No Does the patient have difficulty doing errands alone such as visiting a doctor's office or shopping?: No    Assessment & Plan  1. Encounter for routine history and physical exam for male  Discussed importance of 150 minutes of physical activity weekly, eat two servings of fish weekly, eat one serving of tree nuts ( cashews, pistachios, pecans, almonds.Marland Kitchen) every other day, eat 6 servings of fruit/vegetables daily and drink plenty of water and avoid sweet beverages.  - Lipid panel - Hemoglobin A1c - Comprehensive metabolic panel - CBC with Differential/Platelet - Vitamin B12 - TSH - VITAMIN D 25 Hydroxy (Vit-D Deficiency, Fractures)

## 2018-02-05 LAB — COMPREHENSIVE METABOLIC PANEL
A/G RATIO: 1.8 (ref 1.2–2.2)
ALT: 26 IU/L (ref 0–44)
AST: 21 IU/L (ref 0–40)
Albumin: 4.5 g/dL (ref 3.5–5.5)
Alkaline Phosphatase: 51 IU/L (ref 39–117)
BILIRUBIN TOTAL: 1 mg/dL (ref 0.0–1.2)
BUN / CREAT RATIO: 17 (ref 9–20)
BUN: 18 mg/dL (ref 6–24)
CALCIUM: 9.4 mg/dL (ref 8.7–10.2)
CHLORIDE: 104 mmol/L (ref 96–106)
CO2: 24 mmol/L (ref 20–29)
Creatinine, Ser: 1.09 mg/dL (ref 0.76–1.27)
GFR calc non Af Amer: 74 mL/min/{1.73_m2} (ref >59)
GFR, EST AFRICAN AMERICAN: 85 mL/min/{1.73_m2} (ref >59)
GLOBULIN, TOTAL: 2.5 g/dL (ref 1.5–4.5)
Glucose: 86 mg/dL (ref 65–99)
POTASSIUM: 4.7 mmol/L (ref 3.5–5.2)
SODIUM: 144 mmol/L (ref 134–144)
Total Protein: 7 g/dL (ref 6.0–8.5)

## 2018-02-05 LAB — CBC WITH DIFFERENTIAL/PLATELET
BASOS: 0 %
Basophils Absolute: 0 10*3/uL (ref 0.0–0.2)
EOS (ABSOLUTE): 0.1 10*3/uL (ref 0.0–0.4)
Eos: 2 %
Hematocrit: 44.7 % (ref 37.5–51.0)
Hemoglobin: 15.8 g/dL (ref 13.0–17.7)
IMMATURE GRANS (ABS): 0 10*3/uL (ref 0.0–0.1)
Immature Granulocytes: 0 %
LYMPHS ABS: 2 10*3/uL (ref 0.7–3.1)
Lymphs: 40 %
MCH: 30.4 pg (ref 26.6–33.0)
MCHC: 35.3 g/dL (ref 31.5–35.7)
MCV: 86 fL (ref 79–97)
MONOS ABS: 0.2 10*3/uL (ref 0.1–0.9)
Monocytes: 5 %
NEUTROS ABS: 2.7 10*3/uL (ref 1.4–7.0)
Neutrophils: 53 %
Platelets: 157 10*3/uL (ref 150–379)
RBC: 5.19 x10E6/uL (ref 4.14–5.80)
RDW: 13.4 % (ref 12.3–15.4)
WBC: 5.1 10*3/uL (ref 3.4–10.8)

## 2018-02-05 LAB — LIPID PANEL
CHOL/HDL RATIO: 2.6 ratio (ref 0.0–5.0)
CHOLESTEROL TOTAL: 136 mg/dL (ref 100–199)
HDL: 53 mg/dL (ref >39)
LDL CALC: 67 mg/dL (ref 0–99)
Triglycerides: 78 mg/dL (ref 0–149)
VLDL CHOLESTEROL CAL: 16 mg/dL (ref 5–40)

## 2018-02-05 LAB — HEMOGLOBIN A1C
Est. average glucose Bld gHb Est-mCnc: 97 mg/dL
Hgb A1c MFr Bld: 5 % (ref 4.8–5.6)

## 2018-02-05 LAB — TSH: TSH: 1.62 u[IU]/mL (ref 0.450–4.500)

## 2018-02-05 LAB — VITAMIN B12: Vitamin B-12: 1250 pg/mL — ABNORMAL HIGH (ref 232–1245)

## 2018-02-05 LAB — VITAMIN D 25 HYDROXY (VIT D DEFICIENCY, FRACTURES): Vit D, 25-Hydroxy: 42.6 ng/mL (ref 30.0–100.0)

## 2018-03-03 DIAGNOSIS — G4733 Obstructive sleep apnea (adult) (pediatric): Secondary | ICD-10-CM | POA: Diagnosis not present

## 2018-03-27 DIAGNOSIS — R600 Localized edema: Secondary | ICD-10-CM | POA: Diagnosis not present

## 2018-04-06 ENCOUNTER — Ambulatory Visit (INDEPENDENT_AMBULATORY_CARE_PROVIDER_SITE_OTHER): Payer: 59 | Admitting: Nurse Practitioner

## 2018-04-06 ENCOUNTER — Encounter: Payer: Self-pay | Admitting: Nurse Practitioner

## 2018-04-06 VITALS — BP 126/70 | HR 87 | Temp 98.3°F | Resp 18 | Ht 74.0 in | Wt 223.9 lb

## 2018-04-06 DIAGNOSIS — M7989 Other specified soft tissue disorders: Secondary | ICD-10-CM

## 2018-04-06 NOTE — Progress Notes (Addendum)
Name: Willie Atkins   MRN: [id number]    DOB: 19-Jun-1961   Date:04/06/2018       Progress Note  Subjective  Chief Complaint  Chief Complaint  Patient presents with  . Edema    right foot swelling and tingling, no pain    HPI  Onset approximately 4 months Location- right foot and ankle swelling Aggravating factors- after sitting all day Alleviated when prone   Noticed tingling over the past 3 weeks intermittent- typically appears at the end of the work day. Denies any known injury, trauma, surgeries, no red, rash, heat.  Has had long travels, 3 trips to [place], no pain, no calf involvement.    Patient Active Problem List   Diagnosis Date Noted  . Plantar fasciitis 08/14/2016  . Breast tenderness in male 08/31/2015  . Gynecomastia, male 08/31/2015  . Allergic rhinitis 08/15/2015  . Mitral valve disorder 08/15/2015  . 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 Dyslipidemia 08/14/2015  . Hypogonadism male 08/14/2015  . Metabolic syndrome 08/14/2015  . Depression with anxiety 08/14/2015  . OSA on CPAP 08/14/2015  . S/P arthroscopic surgery of left knee 08/14/2015  . Umbilical hernia without obstruction and without gangrene 08/14/2015  . Left Achilles tendinitis 08/14/2015    Past Medical History:  Diagnosis Date  . Allergy   . Depression   . Elevated hematocrit   . Hematuria syndrome   . Hyperlipidemia   . Insomnia   . Left knee pain   . Obesity   . Old tear of meniscus of left knee   . OSA (obstructive sleep apnea)   . Pain medication agreement   . Tendonitis, Achilles, left   . Testicular failure     Past Surgical History:  Procedure Laterality Date  . KNEE SURGERY Left 08/2014  . VASECTOMY      Social History   Tobacco Use  . Smoking status: Never Smoker  . Smokeless tobacco: Never Used  Substance Use Topics  . Alcohol use: Yes    Alcohol/week: 1.2 oz    Types: 2 Glasses of wine per week    Comment: Wine with dinner nightly     Current  Outpatient Medications:  .  ALPRAZolam (XANAX) 0.5 MG tablet, Take 1 tablet (0.5 mg total) at bedtime as needed by mouth for anxiety., Disp: 30 tablet, Rfl: 2 .  aspirin 81 MG tablet, Take 81 mg by mouth daily., Disp: , Rfl:  .  cyanocobalamin 1000 MCG tablet, Take 1,000 mcg by mouth daily., Disp: , Rfl:  .  Multiple Vitamin (MULTIVITAMIN WITH MINERALS) TABS tablet, Take 1 tablet daily by mouth., Disp: , Rfl:   No Known Allergies  ROS  Constitutional: Negative for fever or weight change.  Respiratory: Negative for cough and shortness of breath.   Cardiovascular: Negative for chest pain or palpitations.  Gastrointestinal: Negative for abdominal pain, no bowel changes.  Musculoskeletal: Negative for gait problem or joint swelling.  Skin: Negative for rash.  Neurological: Negative for dizziness or headache.  No other specific complaints in a complete review of systems (except as listed in HPI above).  Objective  Vitals:   04/06/18 1353  BP: 126/70  Pulse: 87  Resp: 18  Temp: 98.3 F (36.8 C)  TempSrc: Oral  SpO2: 97%  Weight: 223 lb 14.4 oz (101.6 kg)  Height: 6\' 2"  (1.88 m)    Body mass index is 28.75 kg/m.  Nursing Note and Vital Signs reviewed.  Physical Exam   Constitutional: Patient  appears well-developed and well-nourished.  No distress.  Cardiovascular: Normal rate, regular rhythm, S1/S2 present.  No murmur or rub heard. Pulses intact  Pulmonary/Chest: Effort normal and breath sounds clear. No respiratory distress or retractions. Pt uses CPAP- states works well  Abdominal: Soft and non-tender, bowel sounds present, no renal bruits, no abdominal masses or inguinal lymph nodes palpated MSK: right foot and ankle swelling, non-pitting, no warmth, tenderness, pain, bruising notes, pulses intact, cap refill in toes approx 3-4 seconds  Psychiatric: Patient has a normal mood and affect. behavior is normal. Judgment and thought content normal.  No results found for this or  any previous visit (from the past 72 hour(s)).  Assessment & Plan  1. Swelling of right foot - compression, elevation, low-salt  - Ambulatory referral to Vascular Surgery    -Red flags and when to present for emergency care or RTC including fever >101.82F, chest pain, shortness of breath, new/worsening/un-resolving symptoms,  reviewed with patient at time of visit. Follow up and care instructions discussed and provided in AVS.   I have reviewed this encounter including the documentation in this note and/or discussed this patient with the provider, Deon Pilling, LPN. I am certifying that I agree with the content of this note as supervising physician.  Alba Cory, MD [facility] 04/06/2018, 4:06 PM

## 2018-04-06 NOTE — Patient Instructions (Signed)
-   Low- salt diet, compression stocking during the day; take them off every night  - refer to vascular  If you have not heard anything from my staff in a week about any orders/referrals/studies from today, please contact us here to follow-up [phone]  Edema Edema is when you have too much fluid in your body or under your skin. Edema may make your legs, feet, and ankles swell up. Swelling is also common in looser tissues, like around your eyes. This is a common condition. It gets more common as you get older. There are many possible causes of edema. Eating too much salt (sodium) and being on your feet or sitting for a long time can cause edema in your legs, feet, and ankles. Hot weather may make edema worse. Edema is usually painless. Your skin may look swollen or shiny. Follow these instructions at home:  Keep the swollen body part raised (elevated) above the level of your heart when you are sitting or lying down.  Do not sit still or stand for a long time.  Do not wear tight clothes. Do not wear garters on your upper legs.  Exercise your legs. This can help the swelling go down.  Wear elastic bandages or support stockings as told by your doctor.  Eat a low-salt (low-sodium) diet to reduce fluid as told by your doctor.  Depending on the cause of your swelling, you may need to limit how much fluid you drink (fluid restriction).  Take over-the-counter and prescription medicines only as told by your doctor. Contact a doctor if:  Treatment is not working.  You have heart, liver, or kidney disease and have symptoms of edema.  You have sudden and unexplained weight gain. Get help right away if:  You have shortness of breath or chest pain.  You cannot breathe when you lie down.  You have pain, redness, or warmth in the swollen areas.  You have heart, liver, or kidney disease and get edema all of a sudden.  You have a fever and your symptoms get worse all of a  sudden. Summary  Edema is when you have too much fluid in your body or under your skin.  Edema may make your legs, feet, and ankles swell up. Swelling is also common in looser tissues, like around your eyes.  Raise (elevate) the swollen body part above the level of your heart when you are sitting or lying down.  Follow your doctor's instructions about diet and how much fluid you can drink (fluid restriction). This information is not intended to replace advice given to you by your health care provider. Make sure you discuss any questions you have with your health care provider. Document Released: 05/20/2008 Document Revised: 12/20/2016 Document Reviewed: 12/20/2016 Elsevier Interactive Patient Education  2017 [employer]

## 2018-04-15 ENCOUNTER — Encounter: Payer: Self-pay | Admitting: Family Medicine

## 2018-04-23 ENCOUNTER — Other Ambulatory Visit: Payer: Self-pay

## 2018-04-23 DIAGNOSIS — M7989 Other specified soft tissue disorders: Secondary | ICD-10-CM

## 2018-05-04 ENCOUNTER — Ambulatory Visit: Payer: 59 | Admitting: Family Medicine

## 2018-05-28 DIAGNOSIS — G4733 Obstructive sleep apnea (adult) (pediatric): Secondary | ICD-10-CM | POA: Diagnosis not present

## 2018-06-05 ENCOUNTER — Ambulatory Visit (HOSPITAL_COMMUNITY)
Admission: RE | Admit: 2018-06-05 | Discharge: 2018-06-05 | Disposition: A | Discharge status: Home / Self Care | Payer: 59 | Source: Ambulatory Visit | Attending: Vascular Surgery | Admitting: Vascular Surgery

## 2018-06-05 ENCOUNTER — Encounter: Payer: Self-pay | Admitting: Vascular Surgery

## 2018-06-05 ENCOUNTER — Ambulatory Visit (INDEPENDENT_AMBULATORY_CARE_PROVIDER_SITE_OTHER): Payer: 59 | Admitting: Vascular Surgery

## 2018-06-05 VITALS — BP 156/96 | HR 84 | Resp 16 | Ht 74.0 in | Wt 233.9 lb

## 2018-06-05 DIAGNOSIS — M7989 Other specified soft tissue disorders: Secondary | ICD-10-CM | POA: Diagnosis not present

## 2018-06-05 NOTE — Progress Notes (Signed)
Patient ID: DAI MCADAMS, male   DOB: Mar 21, 1961, 57 y.o.   MRN: [id number]  Reason for Consult: Foot Swelling   Referred by Alba Cory, MD  Subjective:     HPI:  Willie Atkins is a 57 y.o. male without significant past medical history.  He does have a recent 70 pound weight loss due to a weight loss challenge at work.  Notes that he has had right foot and ankle swelling for the past few months.  He has not had any trauma to the ankle that he can recall.  He has not had any swelling of his left lower extremity.  Does not have any history of DVT or family history of blood clotting issues.  States that the swelling improves when he is at rest with his leg elevated quickly returns.  He has worn gentle compression stockings that help.  He travels for work taking very long flights but he is able to stretch his legs.  He is not any tissue loss or ulceration.  He denies any varicosities in his legs  Past Medical History:  Diagnosis Date  . Allergy   . Depression   . Elevated hematocrit   . Hematuria syndrome   . Hyperlipidemia   . Insomnia   . Left knee pain   . Obesity   . Old tear of meniscus of left knee   . OSA (obstructive sleep apnea)   . Pain medication agreement   . Tendonitis, Achilles, left   . Testicular failure    Family History  Problem Relation Age of Onset  . Cancer Mother   . Heart disease Father   . Stroke Father    Past Surgical History:  Procedure Laterality Date  . KNEE SURGERY Left 08/2014  . VASECTOMY      Short Social History:  Social History   Tobacco Use  . Smoking status: Never Smoker  . Smokeless tobacco: Never Used  Substance Use Topics  . Alcohol use: Yes    Alcohol/week: 1.2 oz    Types: 2 Glasses of wine per week    Comment: Wine with dinner nightly    No Known Allergies  Current Outpatient Medications  Medication Sig Dispense Refill  . ALPRAZolam (XANAX) 0.5 MG tablet Take 1 tablet (0.5 mg total) at bedtime as needed by mouth  for anxiety. 30 tablet 2  . aspirin 81 MG tablet Take 81 mg by mouth daily.    . cyanocobalamin 1000 MCG tablet Take 1,000 mcg by mouth daily.     No current facility-administered medications for this visit.     Review of Systems  Constitutional:  Constitutional negative. HENT: HENT negative.  Eyes: Eyes negative.  Respiratory: Respiratory negative.  Cardiovascular: Positive for leg swelling.  GI: Gastrointestinal negative.  Musculoskeletal: Musculoskeletal negative.  Skin: Skin negative.  Neurological: Neurological negative. Psychiatric: Psychiatric negative.        Objective:  Objective   Vitals:   06/05/18 1456  BP: (!) 156/96  Pulse: 84  Resp: 16  SpO2: 99%  Weight: 233 lb 14.4 oz (106.1 kg)  Height: 6\' 2"  (1.88 m)   Body mass index is 30.03 kg/m.  Physical Exam  Constitutional: He is oriented to person, place, and time. He appears well-developed.  Eyes: Pupils are equal, round, and reactive to light.  Neck: Normal range of motion.  Cardiovascular: Normal rate.  Pulses:      Radial pulses are 2+ on the right side, and 2+ on the left  side.       Dorsalis pedis pulses are 2+ on the right side, and 2+ on the left side.  Abdominal: Soft. He exhibits no mass.  Musculoskeletal:  Edema is confined to right foot and ankle and is 1+ pitting  Neurological: He is alert and oriented to person, place, and time.  Skin: Skin is warm and dry. Capillary refill takes more than 3 seconds.    Data: I have independently interpreted his lower extremity reflux study which demonstrates a GSV 0.68 cm at the junction down to 0.43 at the distal thigh.  There is reflux 1.1 seconds in the common femoral vein but no superficial reflux in the right lower extremity.     Assessment/Plan:     57 year old male with isolated right foot and ankle swelling without any discernible risk factors.  He does not have any superficial venous reflux to suggest this is causing the issue does not have  any varicosities associated.  From that standpoint I recommended continued activity with elevation when he is recumbent as well as compression stockings as high and tight as he can tolerate.  He should be good with moderate compression that is knee-high.  He demonstrates good understanding and states that he will be compliant with compression stockings.  He can follow-up on a as needed basis per     Maeola HarmanBrandon Christopher Shonda Mandarino MD [facility]

## 2018-06-05 NOTE — Progress Notes (Signed)
Ma

## 2018-09-29 DIAGNOSIS — Z23 Encounter for immunization: Secondary | ICD-10-CM | POA: Diagnosis not present

## 2018-09-30 DIAGNOSIS — G4733 Obstructive sleep apnea (adult) (pediatric): Secondary | ICD-10-CM | POA: Diagnosis not present

## 2019-02-01 DIAGNOSIS — J101 Influenza due to other identified influenza virus with other respiratory manifestations: Secondary | ICD-10-CM | POA: Diagnosis not present

## 2019-02-15 DIAGNOSIS — J111 Influenza due to unidentified influenza virus with other respiratory manifestations: Secondary | ICD-10-CM | POA: Diagnosis not present

## 2019-02-19 ENCOUNTER — Encounter: Payer: 59 | Admitting: Family Medicine

## 2019-03-15 DIAGNOSIS — G4733 Obstructive sleep apnea (adult) (pediatric): Secondary | ICD-10-CM | POA: Diagnosis not present

## 2019-03-17 ENCOUNTER — Encounter: Payer: 59 | Admitting: Family Medicine

## 2019-03-18 ENCOUNTER — Other Ambulatory Visit: Payer: Self-pay

## 2019-03-18 ENCOUNTER — Ambulatory Visit (INDEPENDENT_AMBULATORY_CARE_PROVIDER_SITE_OTHER): Payer: 59 | Admitting: Family Medicine

## 2019-03-18 ENCOUNTER — Encounter: Payer: Self-pay | Admitting: Family Medicine

## 2019-03-18 VITALS — BP 120/80 | HR 90 | Temp 98.1°F | Resp 16 | Ht 73.0 in | Wt 240.2 lb

## 2019-03-18 DIAGNOSIS — M7989 Other specified soft tissue disorders: Secondary | ICD-10-CM

## 2019-03-18 DIAGNOSIS — Z0000 Encounter for general adult medical examination without abnormal findings: Secondary | ICD-10-CM

## 2019-03-18 DIAGNOSIS — E8881 Metabolic syndrome: Secondary | ICD-10-CM

## 2019-03-18 DIAGNOSIS — Z125 Encounter for screening for malignant neoplasm of prostate: Secondary | ICD-10-CM

## 2019-03-18 DIAGNOSIS — E538 Deficiency of other specified B group vitamins: Secondary | ICD-10-CM

## 2019-03-18 DIAGNOSIS — E785 Hyperlipidemia, unspecified: Secondary | ICD-10-CM

## 2019-03-18 DIAGNOSIS — Z9989 Dependence on other enabling machines and devices: Secondary | ICD-10-CM

## 2019-03-18 DIAGNOSIS — E669 Obesity, unspecified: Secondary | ICD-10-CM

## 2019-03-18 DIAGNOSIS — G4733 Obstructive sleep apnea (adult) (pediatric): Secondary | ICD-10-CM | POA: Diagnosis not present

## 2019-03-18 NOTE — Progress Notes (Signed)
Name: Willie Atkins   MRN: [id number]    DOB: [birth year]   Date:03/18/2019       Progress Note  Subjective  Chief Complaint  Chief Complaint  Patient presents with  . Annual Exam    HPI  Patient presents for annual CPE and follow up  OSA: since he lost weight ( starting weight was 300 lbs) he has gone down on CPAP pressure to 10 mmH20 used to be 13 in the. He denies snoring, wakes up feeling refreshed and denies morning headaches.   Dyslipidemia: he had low HDL, but last year with the weight loss and dietary changes his HDL went up 53, LDL was 67  Metabolic Syndrome: in the past, however with dietary changes and increase in exercise he has been doing well, last A1C was 5%, denies polyphagia, polyuria or polydipsia   USPSTF grade A and B recommendations:  Diet: he drinks a fruit smoothie at home for breakfast , lunch is usually fast food or casual restaurant food while at work, dinner is at home.  Exercise: continue physical activity   Depression: phq 9 is negative Depression screen [facility] 2/9 03/18/2019 02/04/2018 05/07/2017 11/28/2016 08/01/2016  Decreased Interest 0 0 0 0 0  Down, Depressed, Hopeless 0 1 0 0 0  PHQ - 2 Score 0 1 0 0 0  Altered sleeping 1 - - - -  Tired, decreased energy 0 - - - -  Change in appetite 0 - - - -  Feeling bad or failure about yourself  0 - - - -  Trouble concentrating 0 - - - -  Moving slowly or fidgety/restless 0 - - - -  Suicidal thoughts 0 - - - -  PHQ-9 Score 1 - - - -    Hypertension:  BP Readings from Last 3 Encounters:  03/18/19 120/80  06/05/18 (!) 156/96  04/06/18 126/70    Obesity: Wt Readings from Last 3 Encounters:  03/18/19 240 lb 3.2 oz (109 kg)  06/05/18 233 lb 14.4 oz (106.1 kg)  04/06/18 223 lb 14.4 oz (101.6 kg)   BMI Readings from Last 3 Encounters:  03/18/19 31.69 kg/m  06/05/18 30.03 kg/m  04/06/18 28.75 kg/m     Lipids:  Lab Results  Component Value Date   CHOL 136 02/04/2018   CHOL 143 05/07/2017   CHOL  144 08/01/2016   Lab Results  Component Value Date   HDL 53 02/04/2018   HDL 38 (L) 05/07/2017   HDL 38 (L) 08/01/2016   Lab Results  Component Value Date   LDLCALC 67 02/04/2018   LDLCALC 83 05/07/2017   LDLCALC 81 08/01/2016   Lab Results  Component Value Date   TRIG 78 02/04/2018   TRIG 111 05/07/2017   TRIG 124 08/01/2016   Lab Results  Component Value Date   CHOLHDL 2.6 02/04/2018   CHOLHDL 3.8 05/07/2017   CHOLHDL 3.8 08/01/2016   No results found for: LDLDIRECT Glucose:  Glucose  Date Value Ref Range Status  02/04/2018 86 65 - 99 mg/dL Final  40/98/1191 478 (H) 65 - 99 mg/dL Final   Glucose, Bld  Date Value Ref Range Status  05/07/2017 91 65 - 99 mg/dL Final  29/56/2130 865 (H) 65 - 99 mg/dL Final      [facility] Visit from 03/18/2019 in [facility]  AUDIT-C Score  4      Married STD testing and prevention (HIV/chl/gon/syphilis): N/A Hep C: up to date   Skin cancer: discussed  atypical lesions  Colorectal cancer: repeat in 2021 Prostate cancer: AUA of 6, we will check PSA  Lab Results  Component Value Date   PSA 0.5 normal 01/25/2014    IPSS Questionnaire (AUA-7): Over the past month.   1)  How often have you had a sensation of not emptying your bladder completely after you finish urinating?  1 - Less than 1 time in 5  2)  How often have you had to urinate again less than two hours after you finished urinating? 2 - Less than half the time  3)  How often have you found you stopped and started again several times when you urinated?  0 - Not at all  4) How difficult have you found it to postpone urination?  1 - Less than 1 time in 5  5) How often have you had a weak urinary stream?  0 - Not at all  6) How often have you had to push or strain to begin urination?  0 - Not at all  7) How many times did you most typically get up to urinate from the time you went to bed until the time you got up in the morning?  2 - 2 times  Total score:   0-7 mildly symptomatic   8-19 moderately symptomatic   20-35 severely symptomatic    Lung cancer:   Low Dose CT Chest recommended if Age 91-80 years, 30 pack-year currently smoking OR have quit w/in 15years. Patient does not qualify.   AAA:  The USPSTF recommends one-time screening with ultrasonography in men ages 56 to 35 years who have ever smoked ECG: done 2015   Advanced Care Planning: A voluntary discussion about advance care planning including the explanation and discussion of advance directives.  Discussed health care proxy and Living will, and the patient was able to identify a health care proxy as wife .  Patient does have a living will at present time. I  Patient Active Problem List   Diagnosis Date Noted  . Breast tenderness in male 08/31/2015  . Gynecomastia, male 08/31/2015  . Allergic rhinitis 08/15/2015  . Mitral valve disorder 08/15/2015  . 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 Dyslipidemia 08/14/2015  . Hypogonadism male 08/14/2015  . Metabolic syndrome 08/14/2015  . Depression with anxiety 08/14/2015  . OSA on CPAP 08/14/2015  . S/P arthroscopic surgery of left knee 08/14/2015  . Umbilical hernia without obstruction and without gangrene 08/14/2015  . Left Achilles tendinitis 08/14/2015    Past Surgical History:  Procedure Laterality Date  . KNEE SURGERY Left 08/2014  . VASECTOMY      Family History  Problem Relation Age of Onset  . Cancer Mother   . Heart disease Father   . Stroke Father     Social History   Socioeconomic History  . Marital status: Married    Spouse name: [name]   . Number of children: 2  . Years of education: Not on file  . Highest education level: Associate degree: academic program  Occupational History  . Occupation: Production assistant, radio   Social Needs  . Financial resource strain: Not hard at all  . Food insecurity:    Worry: Never true    Inability: Never true  . Transportation needs:    Medical: No     Non-medical: No  Tobacco Use  . Smoking status: Never Smoker  . Smokeless tobacco: Never Used  Substance and Sexual Activity  . Alcohol use: Yes  Alcohol/week: 2.0 standard drinks    Types: 2 Glasses of wine per week    Comment: Wine with dinner nightly  . Drug use: No  . Sexual activity: Never  Lifestyle  . Physical activity:    Days per week: 7 days    Minutes per session: 30 min  . Stress: To some extent  Relationships  . Social connections:    Talks on phone: More than three times a week    Gets together: More than three times a week    Attends religious service: More than 4 times per year    Active member of club or organization: Yes    Attends meetings of clubs or organizations: More than 4 times per year    Relationship status: Married  . Intimate partner violence:    Fear of current or ex partner: No    Emotionally abused: No    Physically abused: No    Forced sexual activity: No  Other Topics Concern  . Not on file  Social History Narrative  . Not on file     Current Outpatient Medications:  .  ALPRAZolam (XANAX) 0.5 MG tablet, Take 1 tablet (0.5 mg total) at bedtime as needed by mouth for anxiety., Disp: 30 tablet, Rfl: 2 .  cyanocobalamin 1000 MCG tablet, Take 1,000 mcg by mouth daily., Disp: , Rfl:   No Known Allergies   ROS  Constitutional: Negative for fever or significant  weight change.  Respiratory: Negative for cough and shortness of breath.   Cardiovascular: Negative for chest pain or palpitations.  Gastrointestinal: Negative for abdominal pain, no bowel changes.  Musculoskeletal: Negative for gait problem or joint swelling. He has swelling of right foot  Skin: Negative for rash.  Neurological: Negative for dizziness or headache.  No other specific complaints in a complete review of systems (except as listed in HPI above).   Objective  Vitals:   03/18/19 0755  BP: 120/80  Pulse: 90  Resp: 16  Temp: 98.1 F (36.7 C)  TempSrc: Oral   SpO2: 99%  Weight: 240 lb 3.2 oz (109 kg)  Height: 6\' 1"  (1.854 m)    Body mass index is 31.69 kg/m.  Physical Exam  Constitutional: Patient appears well-developed and well-nourished. Obese  No distress.  HENT: Head: Normocephalic and atraumatic. Ears: B TMs ok, no erythema or effusion; Nose: Nose normal. Mouth/Throat: Oropharynx is clear and moist. No oropharyngeal exudate.  Eyes: Conjunctivae and EOM are normal. Pupils are equal, round, and reactive to light. No scleral icterus.  Neck: Normal range of motion. Neck supple. No JVD present. No thyromegaly present.  Cardiovascular: Normal rate, regular rhythm and normal heart sounds.  No murmur heard. No BLE edema. Pulmonary/Chest: Effort normal and breath sounds normal. No respiratory distress. Abdominal: Soft. Bowel sounds are normal, no distension. There is no tenderness. no masses MALE GENITALIA: Normal descended testes bilaterally, no masses palpated, no hernias, no lesions, no discharge RECTAL: rectal exam, small prostate , no nodules  Musculoskeletal: Normal range of motion, no joint effusions. No gross deformities Neurological: he is alert and oriented to person, place, and time. No cranial nerve deficit. Coordination, balance, strength, speech and gait are normal.  Skin: Skin is warm and dry. No rash noted. No erythema. multiple moles on back  Psychiatric: Patient has a normal mood and affect. behavior is normal. Judgment and thought content normal.   PHQ2/9: Depression screen [facility] 2/9 03/18/2019 02/04/2018 05/07/2017 11/28/2016 08/01/2016  Decreased Interest 0 0 0 0 0  Down, Depressed, Hopeless 0 1 0 0 0  PHQ - 2 Score 0 1 0 0 0  Altered sleeping 1 - - - -  Tired, decreased energy 0 - - - -  Change in appetite 0 - - - -  Feeling bad or failure about yourself  0 - - - -  Trouble concentrating 0 - - - -  Moving slowly or fidgety/restless 0 - - - -  Suicidal thoughts 0 - - - -  PHQ-9 Score 1 - - - -     Fall Risk: Fall Risk   03/18/2019 02/04/2018 11/03/2017 05/07/2017 11/28/2016  Falls in the past year? 0 No No No No  Number falls in past yr: 0 - - - -  Injury with Fall? 0 - - - -     Functional Status Survey: Is the patient deaf or have difficulty hearing?: No Does the patient have difficulty seeing, even when wearing glasses/contacts?: No Does the patient have difficulty concentrating, remembering, or making decisions?: No Does the patient have difficulty walking or climbing stairs?: No Does the patient have difficulty dressing or bathing?: No Does the patient have difficulty doing errands alone such as visiting a doctor's office or shopping?: No    Assessment & Plan  1. Encounter for routine history and physical exam for male  - COMPLETE METABOLIC PANEL WITH GFR  2. OSA on CPAP  Down to  10 pressure from 13 , AHI of around 1 at home  3. B12 deficiency  - B12 and Folate Panel - CBC with Differential/Platelet  4. Dyslipidemia  - Lipid panel  5. Obesity (BMI 30-39.9)  Discussed with the patient the risk posed by an increased BMI. Discussed importance of portion control, calorie counting and at least 150 minutes of physical activity weekly. Avoid sweet beverages and drink more water. Eat at least 6 servings of fruit and vegetables daily   6. Swelling of right foot  - COMPLETE METABOLIC PANEL WITH GFR  7. Metabolic syndrome  - COMPLETE METABOLIC PANEL WITH GFR - Hemoglobin A1c    -Prostate cancer screening and PSA options (with potential risks and benefits of testing vs not testing) were discussed along with recent recs/guidelines. -USPSTF grade A and B recommendations reviewed with patient; age-appropriate recommendations, preventive care, screening tests, etc discussed and encouraged; healthy living encouraged; see AVS for patient education given to patient -Discussed importance of 150 minutes of physical activity weekly, eat two servings of fish weekly, eat one serving of tree nuts (  cashews, pistachios, pecans, almonds.Marland Kitchen.) every other day, eat 6 servings of fruit/vegetables daily and drink plenty of water and avoid sweet beverages.

## 2019-03-19 LAB — COMPLETE METABOLIC PANEL WITH GFR
AG Ratio: 1.6 (calc) (ref 1.0–2.5)
ALT: 18 U/L (ref 9–46)
AST: 15 U/L (ref 10–35)
Albumin: 4.3 g/dL (ref 3.6–5.1)
Alkaline phosphatase (APISO): 55 U/L (ref 35–144)
BUN: 21 mg/dL (ref 7–25)
CO2: 25 mmol/L (ref 20–32)
Calcium: 9.1 mg/dL (ref 8.6–10.3)
Chloride: 107 mmol/L (ref 98–110)
Creat: 0.94 mg/dL (ref 0.70–1.25)
GFR, Est African American: 102 mL/min/{1.73_m2} (ref >=60)
GFR, Est Non African American: 88 mL/min/{1.73_m2} (ref >=60)
Globulin: 2.7 g/dL (calc) (ref 1.9–3.7)
Glucose, Bld: 92 mg/dL (ref 65–99)
Potassium: 4.4 mmol/L (ref 3.5–5.3)
Sodium: 141 mmol/L (ref 135–146)
Total Bilirubin: 0.9 mg/dL (ref 0.2–1.2)
Total Protein: 7 g/dL (ref 6.1–8.1)

## 2019-03-19 LAB — CBC WITH DIFFERENTIAL/PLATELET
Absolute Monocytes: 330 cells/uL (ref 200–950)
Basophils Absolute: 28 cells/uL (ref 0–200)
Basophils Relative: 0.5 %
Eosinophils Absolute: 151 cells/uL (ref 15–500)
Eosinophils Relative: 2.7 %
HCT: 45.6 % (ref 38.5–50.0)
Hemoglobin: 15.7 g/dL (ref 13.2–17.1)
Lymphs Abs: 2066 cells/uL (ref 850–3900)
MCH: 29.7 pg (ref 27.0–33.0)
MCHC: 34.4 g/dL (ref 32.0–36.0)
MCV: 86.2 fL (ref 80.0–100.0)
MPV: 10.5 fL (ref 7.5–12.5)
Monocytes Relative: 5.9 %
Neutro Abs: 3024 cells/uL (ref 1500–7800)
Neutrophils Relative %: 54 %
Platelets: 212 10*3/uL (ref 140–400)
RBC: 5.29 10*6/uL (ref 4.20–5.80)
RDW: 13.1 % (ref 11.0–15.0)
Total Lymphocyte: 36.9 %
WBC: 5.6 10*3/uL (ref 3.8–10.8)

## 2019-03-19 LAB — LIPID PANEL
Cholesterol: 164 mg/dL (ref <200)
HDL: 43 mg/dL (ref >=40)
LDL Cholesterol (Calc): 100 mg/dL (calc) — ABNORMAL HIGH
Non-HDL Cholesterol (Calc): 121 mg/dL (calc) (ref <130)
Total CHOL/HDL Ratio: 3.8 (calc) (ref <5.0)
Triglycerides: 112 mg/dL (ref <150)

## 2019-03-19 LAB — PSA: PSA: 0.7 ng/mL (ref <=4.0)

## 2019-03-19 LAB — HEMOGLOBIN A1C
Hgb A1c MFr Bld: 5.3 % of total Hgb (ref <5.7)
Mean Plasma Glucose: 105 (calc)
eAG (mmol/L): 5.8 (calc)

## 2019-03-19 LAB — B12 AND FOLATE PANEL
Folate: 10.6 ng/mL
Vitamin B-12: 1283 pg/mL — ABNORMAL HIGH (ref 200–1100)

## 2019-08-27 ENCOUNTER — Encounter: Payer: Self-pay | Admitting: Family Medicine

## 2021-01-31 NOTE — Progress Notes (Signed)
Name: Willie Atkins   MRN: [id number]    DOB: 01/16/1961   Date:02/02/2021       Progress Note  Subjective  Chief Complaint  Swollen right foot   HPI  Right foot swelling: he was seen by vascular surgeon back in 2019, normal studies, advised to wear compression stocking hoses. He states symptoms are getting progressively worse. It is now swollen all day, and sometimes has some paresthesias in the evening. No significant redness or increase in warmth, sometimes has some soreness on top of his right foot   Hypogonadism: he has noticed he is not as strong as he used to and decrease in upper arms strength is affecting his hobbies. Explained not likely going to improve with testosterone supplementation and also the risk associated with supplementation. We will check levels as requested  Dyslipidemia: he used to take medications but changed his diet, lost a lot of weight and labs normalized, his last visit here was 2 years ago, he has gained 12 lbs, but still much smaller than he used to be. We will recheck labs  Metabolic syndrome: last A1C was normal, he has a history of elevated bp, dyslipidemia and increase in abdominal girth, we will recheck labs today  OSA/CPAP: he is very compliant , denies morning headaches.   Patient Active Problem List   Diagnosis Date Noted  . Breast tenderness in male 08/31/2015  . Gynecomastia, male 08/31/2015  . Allergic rhinitis 08/15/2015  . Mitral valve disorder 08/15/2015  . 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 Dyslipidemia 08/14/2015  . Hypogonadism male 08/14/2015  . Metabolic syndrome 08/14/2015  . Depression with anxiety 08/14/2015  . OSA on CPAP 08/14/2015  . S/P arthroscopic surgery of left knee 08/14/2015  . Umbilical hernia without obstruction and without gangrene 08/14/2015  . Left Achilles tendinitis 08/14/2015    Past Surgical History:  Procedure Laterality Date  . KNEE SURGERY Left 08/2014  . VASECTOMY      Family History   Problem Relation Age of Onset  . Cancer Mother   . Heart disease Father   . Stroke Father     Social History   Tobacco Use  . Smoking status: Never Smoker  . Smokeless tobacco: Never Used  Substance Use Topics  . Alcohol use: Yes    Alcohol/week: 2.0 standard drinks    Types: 2 Glasses of wine per week    Comment: Wine with dinner nightly     Current Outpatient Medications:  .  cyanocobalamin 1000 MCG tablet, Take 1,000 mcg by mouth daily., Disp: , Rfl:   No Known Allergies  I personally reviewed active problem list, medication list, allergies, family history, social history, health maintenance with the patient/caregiver today.   ROS  Constitutional: Negative for fever or weight change.  Respiratory: Negative for cough and shortness of breath.   Cardiovascular: Negative for chest pain or palpitations.  Gastrointestinal: Negative for abdominal pain, no bowel changes.  Musculoskeletal: Negative for gait problem or joint swelling.  Skin: Negative for rash.  Neurological: Negative for dizziness or headache.  No other specific complaints in a complete review of systems (except as listed in HPI above).  Objective  Vitals:   02/02/21 0743  BP: 132/84  Pulse: 82  Resp: 16  Temp: 98.7 F (37.1 C)  TempSrc: Oral  SpO2: 99%  Weight: 252 lb (114.3 kg)  Height: 6\' 1"  (1.854 m)    Body mass index is 33.25 kg/m.  Physical Exam  Constitutional: Patient appears  well-developed and well-nourished. Obese  No distress.  HEENT: head atraumatic, normocephalic, pupils equal and reactive to light,neck supple Cardiovascular: Normal rate, regular rhythm and normal heart sounds.  No murmur heard. Pulmonary/Chest: Effort normal and breath sounds normal. No respiratory distress. Abdominal: Soft.  There is no tenderness. Muscular Skeletal: right foot is swollen all around, no pain, normal rom, both feet mild erythema on top, normal distal pulses  Psychiatric: Patient has a normal  mood and affect. behavior is normal. Judgment and thought content normal.  PHQ2/9: Depression screen [facility] 2/9 02/02/2021 03/18/2019 02/04/2018 05/07/2017 11/28/2016  Decreased Interest 0 0 0 0 0  Down, Depressed, Hopeless 0 0 1 0 0  PHQ - 2 Score 0 0 1 0 0  Altered sleeping - 1 - - -  Tired, decreased energy - 0 - - -  Change in appetite - 0 - - -  Feeling bad or failure about yourself  - 0 - - -  Trouble concentrating - 0 - - -  Moving slowly or fidgety/restless - 0 - - -  Suicidal thoughts - 0 - - -  PHQ-9 Score - 1 - - -    phq 9 is negative   Fall Risk: Fall Risk  02/02/2021 03/18/2019 02/04/2018 11/03/2017 05/07/2017  Falls in the past year? 0 0 No No No  Number falls in past yr: 0 0 - - -  Injury with Fall? 0 0 - - -    Assessment & Plan  1. Dyslipidemia  - Lipid panel  2. OSA on CPAP  Continue CPAP   3. B12 deficiency  - CBC with Differential/Platelet - COMPLETE METABOLIC PANEL WITH GFR - Vitamin B12  4. Swelling of right foot  - Ambulatory referral to Podiatry  5. Colon cancer screening  - Ambulatory referral to Gastroenterology  6. Prostate cancer screening  - PSA  7. Metabolic syndrome  - Hemoglobin A1c  8. Obesity (BMI 30-39.9)  Gained some weight since 2020   9. Hypogonadism in male  - Testosterone,Free and Total

## 2021-02-02 ENCOUNTER — Encounter: Payer: Self-pay | Admitting: Family Medicine

## 2021-02-02 ENCOUNTER — Other Ambulatory Visit: Payer: Self-pay

## 2021-02-02 ENCOUNTER — Ambulatory Visit: Payer: 59 | Admitting: Family Medicine

## 2021-02-02 VITALS — BP 132/84 | HR 82 | Temp 98.7°F | Resp 16 | Ht 73.0 in | Wt 252.0 lb

## 2021-02-02 DIAGNOSIS — E538 Deficiency of other specified B group vitamins: Secondary | ICD-10-CM

## 2021-02-02 DIAGNOSIS — E785 Hyperlipidemia, unspecified: Secondary | ICD-10-CM | POA: Diagnosis not present

## 2021-02-02 DIAGNOSIS — M7989 Other specified soft tissue disorders: Secondary | ICD-10-CM | POA: Diagnosis not present

## 2021-02-02 DIAGNOSIS — Z125 Encounter for screening for malignant neoplasm of prostate: Secondary | ICD-10-CM

## 2021-02-02 DIAGNOSIS — Z1211 Encounter for screening for malignant neoplasm of colon: Secondary | ICD-10-CM

## 2021-02-02 DIAGNOSIS — E669 Obesity, unspecified: Secondary | ICD-10-CM

## 2021-02-02 DIAGNOSIS — G4733 Obstructive sleep apnea (adult) (pediatric): Secondary | ICD-10-CM | POA: Diagnosis not present

## 2021-02-02 DIAGNOSIS — E8881 Metabolic syndrome: Secondary | ICD-10-CM

## 2021-02-02 DIAGNOSIS — E291 Testicular hypofunction: Secondary | ICD-10-CM

## 2021-02-02 DIAGNOSIS — Z9989 Dependence on other enabling machines and devices: Secondary | ICD-10-CM

## 2021-02-05 LAB — CBC WITH DIFFERENTIAL/PLATELET
MPV: 10.4 fL (ref 7.5–12.5)
WBC: 6.3 10*3/uL (ref 3.8–10.8)

## 2021-02-05 LAB — COMPLETE METABOLIC PANEL WITH GFR
AST: 19 U/L (ref 10–35)
Globulin: 2.9 g/dL (calc) (ref 1.9–3.7)
Potassium: 4.7 mmol/L (ref 3.5–5.3)
Total Bilirubin: 1.1 mg/dL (ref 0.2–1.2)

## 2021-02-06 ENCOUNTER — Encounter: Payer: Self-pay | Admitting: Family Medicine

## 2021-02-08 LAB — LIPID PANEL
Cholesterol: 147 mg/dL (ref <200)
HDL: 46 mg/dL (ref >=40)
LDL Cholesterol (Calc): 78 mg/dL (calc)
Non-HDL Cholesterol (Calc): 101 mg/dL (calc) (ref <130)
Total CHOL/HDL Ratio: 3.2 (calc) (ref <5.0)
Triglycerides: 130 mg/dL (ref <150)

## 2021-02-08 LAB — TESTOSTERONE, FREE & TOTAL
Free Testosterone: 32.8 pg/mL — ABNORMAL LOW (ref 35.0–155.0)
Testosterone, Total, LC-MS-MS: 164 ng/dL — ABNORMAL LOW (ref 250–1100)

## 2021-02-08 LAB — COMPLETE METABOLIC PANEL WITH GFR
AG Ratio: 1.5 (calc) (ref 1.0–2.5)
ALT: 21 U/L (ref 9–46)
Albumin: 4.3 g/dL (ref 3.6–5.1)
Alkaline phosphatase (APISO): 55 U/L (ref 35–144)
BUN: 19 mg/dL (ref 7–25)
CO2: 30 mmol/L (ref 20–32)
Calcium: 9.5 mg/dL (ref 8.6–10.3)
Chloride: 105 mmol/L (ref 98–110)
Creat: 1.06 mg/dL (ref 0.70–1.25)
GFR, Est African American: 87 mL/min/{1.73_m2} (ref >=60)
GFR, Est Non African American: 75 mL/min/{1.73_m2} (ref >=60)
Glucose, Bld: 94 mg/dL (ref 65–99)
Sodium: 141 mmol/L (ref 135–146)
Total Protein: 7.2 g/dL (ref 6.1–8.1)

## 2021-02-08 LAB — CBC WITH DIFFERENTIAL/PLATELET
Absolute Monocytes: 391 cells/uL (ref 200–950)
Basophils Absolute: 38 cells/uL (ref 0–200)
Basophils Relative: 0.6 %
Eosinophils Absolute: 132 cells/uL (ref 15–500)
Eosinophils Relative: 2.1 %
HCT: 47.7 % (ref 38.5–50.0)
Hemoglobin: 16.3 g/dL (ref 13.2–17.1)
Lymphs Abs: 2318 cells/uL (ref 850–3900)
MCH: 29.4 pg (ref 27.0–33.0)
MCHC: 34.2 g/dL (ref 32.0–36.0)
MCV: 85.9 fL (ref 80.0–100.0)
Monocytes Relative: 6.2 %
Neutro Abs: 3421 cells/uL (ref 1500–7800)
Neutrophils Relative %: 54.3 %
Platelets: 189 10*3/uL (ref 140–400)
RBC: 5.55 10*6/uL (ref 4.20–5.80)
RDW: 12.6 % (ref 11.0–15.0)
Total Lymphocyte: 36.8 %

## 2021-02-08 LAB — HEMOGLOBIN A1C
Hgb A1c MFr Bld: 5.2 % of total Hgb (ref <5.7)
Mean Plasma Glucose: 103 mg/dL
eAG (mmol/L): 5.7 mmol/L

## 2021-02-08 LAB — VITAMIN B12: Vitamin B-12: 512 pg/mL (ref 200–1100)

## 2021-02-08 LAB — PSA: PSA: 0.41 ng/mL (ref <=4.0)

## 2021-02-13 ENCOUNTER — Other Ambulatory Visit: Payer: Self-pay

## 2021-02-13 ENCOUNTER — Ambulatory Visit (INDEPENDENT_AMBULATORY_CARE_PROVIDER_SITE_OTHER): Payer: 59

## 2021-02-13 ENCOUNTER — Ambulatory Visit: Payer: 59 | Admitting: Podiatry

## 2021-02-13 ENCOUNTER — Encounter: Payer: Self-pay | Admitting: Podiatry

## 2021-02-13 ENCOUNTER — Other Ambulatory Visit: Payer: Self-pay | Admitting: Podiatry

## 2021-02-13 DIAGNOSIS — M722 Plantar fascial fibromatosis: Secondary | ICD-10-CM | POA: Diagnosis not present

## 2021-02-13 DIAGNOSIS — M19071 Primary osteoarthritis, right ankle and foot: Secondary | ICD-10-CM

## 2021-02-13 DIAGNOSIS — M1990 Unspecified osteoarthritis, unspecified site: Secondary | ICD-10-CM

## 2021-02-13 NOTE — Progress Notes (Signed)
Subjective:   Patient ID: Willie Atkins, male   DOB: 60 y.o.   MRN: [id number]   HPI 60 year old male presents the office today for concerns of right foot swelling.  This has been ongoing for some time now.  He recently saw his primary care physician who is referred him to vascular surgery.  He states that he has had several tests performed and had been normal and was recommended to wear compression socks.  He states that he started get some pins and needle sensations to his foot and the swelling persisted he presents today for further evaluation.  Denies any recent injury.  Will occasionally describe a pulling sensation on top of his foot.  No redness or warmth associate with the swelling.   Review of Systems  All other systems reviewed and are negative.  Past Medical History:  Diagnosis Date  . Allergy   . Depression   . Elevated hematocrit   . Hematuria syndrome   . Hyperlipidemia   . Insomnia   . Left knee pain   . Obesity   . Old tear of meniscus of left knee   . OSA (obstructive sleep apnea)   . Pain medication agreement   . Tendonitis, Achilles, left   . Testicular failure     Past Surgical History:  Procedure Laterality Date  . KNEE SURGERY Left 08/2014  . VASECTOMY       Current Outpatient Medications:  .  cyanocobalamin 1000 MCG tablet, Take 1,000 mcg by mouth daily., Disp: , Rfl:   No Known Allergies       Objective:  Physical Exam  General: AAO x3, NAD  Dermatological: Skin is warm, dry and supple bilateral. There are no open sores, no preulcerative lesions, no rash or signs of infection present.  Vascular: Dorsalis Pedis artery and Posterior Tibial artery pedal pulses are 2/4 bilateral with immedate capillary fill time.  There is no pain with calf compression, swelling, warmth, erythema.   Neruologic: Grossly intact via light touch bilateral. Negative tinel sign.  Sensation intact with Semmes Weinstein monofilament.  Slight decrease in vibratory  sensation bilaterally.  Musculoskeletal: There is mild swelling present to the right foot.  No erythema or warmth associated swelling.  There is no area of tenderness identified.  Muscular strength 5/5 in all groups tested bilateral.  Gait: Unassisted, Nonantalgic.       Assessment:   Right foot is chronic swelling, ?nerve issues     Plan:  -Treatment options discussed including all alternatives, risks, and complications -Etiology of symptoms were discussed -X-rays were obtained and reviewed with the patient.  There are some arthritic changes present in the midfoot.  Significant calcaneal spurring is present. -Swelling is to the dorsal midfoot.  This could be result of arthritis.  I am ordering MRI given the chronic swelling.  Also he started describing pins-and-needles sensation to the foot.  Discussed possible nerve issues but this could also be result of the swelling.  Underscore the MRI of the right foot and if normal then referred to possible neurology    Vivi Barrack DPM

## 2021-02-13 NOTE — Patient Instructions (Signed)
I have ordered a MRI of the right foot for [facility]. If you do not hear for them about scheduling within the next 1 week, or you have any questions please give [place] a call at [phone].

## 2021-02-16 ENCOUNTER — Other Ambulatory Visit: Payer: Self-pay

## 2021-02-16 ENCOUNTER — Telehealth (INDEPENDENT_AMBULATORY_CARE_PROVIDER_SITE_OTHER): Payer: Self-pay | Admitting: Gastroenterology

## 2021-02-16 DIAGNOSIS — Z1211 Encounter for screening for malignant neoplasm of colon: Secondary | ICD-10-CM

## 2021-02-16 MED ORDER — PEG 3350-KCL-NA BICARB-NACL 420 G PO SOLR: 4000.0000 mL | Freq: Once | ORAL | 0 refills | Status: AC

## 2021-02-16 NOTE — Progress Notes (Signed)
Gastroenterology Pre-Procedure Review  Request Date: Thursday 03/01/21 Requesting Physician: Dr. Maximino Greenland  PATIENT REVIEW QUESTIONS: The patient responded to the following health history questions as indicated:    1. Are you having any GI issues? no 2. Do you have a personal history of Polyps? no 3. Do you have a family history of Colon Cancer or Polyps? no 4. Diabetes Mellitus? no 5. Joint replacements in the past 12 months?no 6. Major health problems in the past 3 months?no 7. Any artificial heart valves, MVP, or defibrillator?no    MEDICATIONS & ALLERGIES:    Patient reports the following regarding taking any anticoagulation/antiplatelet therapy:   Plavix, Coumadin, Eliquis, Xarelto, Lovenox, Pradaxa, Brilinta, or Effient? no Aspirin? no  Patient confirms/reports the following medications:  Current Outpatient Medications  Medication Sig Dispense Refill  . cyanocobalamin 1000 MCG tablet Take 1,000 mcg by mouth daily.     No current facility-administered medications for this visit.    Patient confirms/reports the following allergies:  No Known Allergies  No orders of the defined types were placed in this encounter.   AUTHORIZATION INFORMATION Primary Insurance: 1D#: Group #:  Secondary Insurance: 1D#: Group #:  SCHEDULE INFORMATION: Date: Thursday 03/01/21 Time: Location:[facility]

## 2021-02-23 ENCOUNTER — Encounter: Payer: Self-pay | Admitting: Podiatry

## 2021-02-26 ENCOUNTER — Telehealth: Payer: Self-pay

## 2021-02-26 NOTE — Telephone Encounter (Signed)
Called patient to reschedule his screening colonoscopy from 03/01/2020 to another day since the endoscopic unit is requesting to move since Dr. Maximino Greenland has too many on that date.

## 2021-02-27 ENCOUNTER — Other Ambulatory Visit: Payer: Self-pay

## 2021-02-27 ENCOUNTER — Other Ambulatory Visit
Admission: RE | Admit: 2021-02-27 | Discharge: 2021-02-27 | Disposition: A | Discharge status: Home / Self Care | Payer: 59 | Source: Ambulatory Visit | Attending: Gastroenterology | Admitting: Gastroenterology

## 2021-02-27 DIAGNOSIS — Z20822 Contact with and (suspected) exposure to covid-19: Secondary | ICD-10-CM | POA: Diagnosis not present

## 2021-02-27 DIAGNOSIS — Z01812 Encounter for preprocedural laboratory examination: Secondary | ICD-10-CM | POA: Diagnosis present

## 2021-02-27 LAB — SARS CORONAVIRUS 2 (TAT 6-24 HRS): SARS Coronavirus 2: NEGATIVE

## 2021-02-27 NOTE — Telephone Encounter (Signed)
Called [employer] at [phone] and got the automated system and the system stated that the MRI does not need an authorization. The reference number is [id number]. [name]

## 2021-03-01 ENCOUNTER — Encounter
Admission: RE | Disposition: A | Discharge status: Home / Self Care | Payer: Self-pay | Source: Home / Self Care | Attending: Gastroenterology

## 2021-03-01 ENCOUNTER — Ambulatory Visit: Payer: 59 | Admitting: Anesthesiology

## 2021-03-01 ENCOUNTER — Other Ambulatory Visit: Payer: Self-pay

## 2021-03-01 ENCOUNTER — Ambulatory Visit
Admission: RE | Admit: 2021-03-01 | Discharge: 2021-03-01 | Disposition: A | Discharge status: Home / Self Care | Payer: 59 | Attending: Gastroenterology | Admitting: Gastroenterology

## 2021-03-01 ENCOUNTER — Encounter: Payer: Self-pay | Admitting: Gastroenterology

## 2021-03-01 DIAGNOSIS — E785 Hyperlipidemia, unspecified: Secondary | ICD-10-CM | POA: Diagnosis not present

## 2021-03-01 DIAGNOSIS — G4733 Obstructive sleep apnea (adult) (pediatric): Secondary | ICD-10-CM | POA: Diagnosis not present

## 2021-03-01 DIAGNOSIS — K648 Other hemorrhoids: Secondary | ICD-10-CM | POA: Diagnosis not present

## 2021-03-01 DIAGNOSIS — E669 Obesity, unspecified: Secondary | ICD-10-CM | POA: Insufficient documentation

## 2021-03-01 DIAGNOSIS — Z9852 Vasectomy status: Secondary | ICD-10-CM | POA: Insufficient documentation

## 2021-03-01 DIAGNOSIS — Z1211 Encounter for screening for malignant neoplasm of colon: Secondary | ICD-10-CM | POA: Insufficient documentation

## 2021-03-01 DIAGNOSIS — K635 Polyp of colon: Secondary | ICD-10-CM | POA: Insufficient documentation

## 2021-03-01 DIAGNOSIS — Z6832 Body mass index (BMI) 32.0-32.9, adult: Secondary | ICD-10-CM | POA: Insufficient documentation

## 2021-03-01 HISTORY — PX: COLONOSCOPY WITH PROPOFOL: SHX5780

## 2021-03-01 SURGERY — COLONOSCOPY WITH PROPOFOL
Anesthesia: General

## 2021-03-01 MED ORDER — PROPOFOL 500 MG/50ML IV EMUL
INTRAVENOUS | Status: DC | PRN
  Administered 2021-03-01: 140 ug/kg/min via INTRAVENOUS

## 2021-03-01 MED ORDER — PROPOFOL 10 MG/ML IV BOLUS
INTRAVENOUS | Status: DC | PRN
  Administered 2021-03-01: 80 mg via INTRAVENOUS

## 2021-03-01 MED ORDER — PROPOFOL 500 MG/50ML IV EMUL
INTRAVENOUS | Status: AC
  Filled 2021-03-01: qty 50

## 2021-03-01 MED ORDER — SODIUM CHLORIDE 0.9 % IV SOLN: INTRAVENOUS | Status: DC

## 2021-03-01 NOTE — Anesthesia Postprocedure Evaluation (Signed)
Anesthesia Post Note  Patient: QUINTERRIUS ERRINGTON  Procedure(s) Performed: COLONOSCOPY WITH PROPOFOL (N/A )  Patient location during evaluation: PACU Anesthesia Type: General Level of consciousness: awake and alert Pain management: pain level controlled Vital Signs Assessment: post-procedure vital signs reviewed and stable Respiratory status: spontaneous breathing, nonlabored ventilation, respiratory function stable and patient connected to nasal cannula oxygen Cardiovascular status: blood pressure returned to baseline and stable Postop Assessment: no apparent nausea or vomiting Anesthetic complications: no   No complications documented.   Last Vitals:  Vitals:   03/01/21 1039 03/01/21 1307  BP: (!) 124/100 105/72  Pulse: 85   Resp: 20   Temp: (!) 36.1 C (!) 35.7 C  SpO2: 100%     Last Pain:  Vitals:   03/01/21 1327  TempSrc:   PainSc: 0-No pain                 Yevette Edwards

## 2021-03-01 NOTE — H&P (Signed)
Melodie Bouillon, MD [address], [address], [place], [place], [address] [address], [place], [place], [address] Phone: [phone]  Fax: [phone]  Primary Care Physician:  Alba Cory, MD   Pre-Procedure History & Physical: HPI:  Willie Atkins is a 60 y.o. male is here for a colonoscopy.   Past Medical History:  Diagnosis Date  . Allergy   . Depression   . Elevated hematocrit   . Hematuria syndrome   . Hyperlipidemia   . Insomnia   . Left knee pain   . Obesity   . Old tear of meniscus of left knee   . OSA (obstructive sleep apnea)   . Pain medication agreement   . Tendonitis, Achilles, left   . Testicular failure     Past Surgical History:  Procedure Laterality Date  . KNEE SURGERY Left 08/2014  . VASECTOMY      Prior to Admission medications   Medication Sig Start Date End Date Taking? Authorizing Provider  cyanocobalamin 1000 MCG tablet Take 1,000 mcg by mouth daily.   Yes [provider]  ibuprofen (ADVIL) 200 MG tablet Take 400 mg by mouth every 6 (six) hours as needed.   Yes [provider]    Allergies as of 02/16/2021  . (No Known Allergies)    Family History  Problem Relation Age of Onset  . Cancer Mother   . Heart disease Father   . Stroke Father     Social History   Socioeconomic History  . Marital status: Married    Spouse name: [name]   . Number of children: 2  . Years of education: Not on file  . Highest education level: Associate degree: academic program  Occupational History  . Occupation: Production assistant, radio   Tobacco Use  . Smoking status: Never Smoker  . Smokeless tobacco: Never Used  Vaping Use  . Vaping Use: Never used  Substance and Sexual Activity  . Alcohol use: Yes    Alcohol/week: 2.0 standard drinks    Types: 2 Glasses of wine per week    Comment: Wine with dinner nightly  . Drug use: No  . Sexual activity: Never  Other Topics Concern  . Not on file  Social History  Narrative  . Not on file   Social Determinants of Health   Financial Resource Strain: Not on file  Food Insecurity: Not on file  Transportation Needs: Not on file  Physical Activity: Not on file  Stress: Not on file  Social Connections: Not on file  Intimate Partner Violence: Not on file    Review of Systems: See HPI, otherwise negative ROS  Physical Exam: BP (!) 124/100   Pulse 85   Temp (!) 97 F (36.1 C) (Temporal)   Resp 20   Ht 6\' 1"  (1.854 m)   Wt 110.7 kg   SpO2 100%   BMI 32.19 kg/m  General:   Alert,  pleasant and cooperative in NAD Head:  Normocephalic and atraumatic. Neck:  Supple; no masses or thyromegaly. Lungs:  Clear throughout to auscultation, normal respiratory effort.    Heart:  +S1, +S2, Regular rate and rhythm, No edema. Abdomen:  Soft, nontender and nondistended. Normal bowel sounds, without guarding, and without rebound.   Neurologic:  Alert and  oriented x4;  grossly normal neurologically.  Impression/Plan: Willie Atkins is here for a colonoscopy to be performed for average risk screening.  Risks, benefits, limitations, and alternatives regarding  colonoscopy have been reviewed with the patient.  Questions have been answered.  All parties agreeable.   Pasty Spillers, MD  03/01/2021, 11:56 AM

## 2021-03-01 NOTE — Transfer of Care (Signed)
Immediate Anesthesia Transfer of Care Note  Patient: Willie Atkins  Procedure(s) Performed: COLONOSCOPY WITH PROPOFOL (N/A )  Patient Location: PACU  Anesthesia Type:General  Level of Consciousness: awake and sedated  Airway & Oxygen Therapy: Patient Spontanous Breathing and Patient connected to nasal cannula oxygen  Post-op Assessment: Report given to RN and Post -op Vital signs reviewed and stable  Post vital signs: Reviewed and stable  Last Vitals:  Vitals Value Taken Time  BP    Temp    Pulse    Resp    SpO2      Last Pain:  Vitals:   03/01/21 1039  TempSrc: Temporal  PainSc: 0-No pain         Complications: No complications documented.

## 2021-03-01 NOTE — Anesthesia Preprocedure Evaluation (Signed)
Anesthesia Evaluation  Patient identified by MRN, date of birth, ID band Patient awake    Reviewed: Allergy & Precautions, NPO status , Patient's Chart, lab work & pertinent test results  Airway Mallampati: II  TM Distance: >3 FB     Dental   Pulmonary sleep apnea ,    Pulmonary exam normal        Cardiovascular negative cardio ROS Normal cardiovascular exam     Neuro/Psych PSYCHIATRIC DISORDERS Anxiety Depression negative neurological ROS     GI/Hepatic negative GI ROS, Neg liver ROS,   Endo/Other  negative endocrine ROS  Renal/GU negative Renal ROS  negative genitourinary   Musculoskeletal negative musculoskeletal ROS (+)   Abdominal Normal abdominal exam  (+)   Peds negative pediatric ROS (+)  Hematology negative hematology ROS (+)   Anesthesia Other Findings Past Medical History: No date: Allergy No date: Depression No date: Elevated hematocrit No date: Hematuria syndrome No date: Hyperlipidemia No date: Insomnia No date: Left knee pain No date: Obesity No date: Old tear of meniscus of left knee No date: OSA (obstructive sleep apnea) No date: Pain medication agreement No date: Tendonitis, Achilles, left No date: Testicular failure  Reproductive/Obstetrics                             Anesthesia Physical Anesthesia Plan  ASA: II  Anesthesia Plan: General   Post-op Pain Management:    Induction: Intravenous  PONV Risk Score and Plan:   Airway Management Planned: Nasal Cannula  Additional Equipment:   Intra-op Plan:   Post-operative Plan:   Informed Consent: I have reviewed the patients History and Physical, chart, labs and discussed the procedure including the risks, benefits and alternatives for the proposed anesthesia with the patient or authorized representative who has indicated his/her understanding and acceptance.     Dental advisory given  Plan Discussed  with: CRNA and Surgeon  Anesthesia Plan Comments:         Anesthesia Quick Evaluation

## 2021-03-01 NOTE — Op Note (Signed)
[facility] Gastroenterology Patient Name: Willie Atkins Procedure Date: 03/01/2021 11:47 AM MRN: [id number] Account #: [id number] Date of Birth: 09/29/1961 Admit Type: Outpatient Age: 60 Room: [facility] ENDO ROOM 2 Gender: Male Note Status: Finalized Procedure:             Colonoscopy Indications:           Screening for colorectal malignant neoplasm Providers:             Kayleana Waites B. Maximino Greenland MD, MD Referring MD:          Onnie Boer. Sowles, MD (Referring MD) Medicines:             Monitored Anesthesia Care Complications:         No immediate complications. Procedure:             Pre-Anesthesia Assessment:                        - ASA Grade Assessment: II - A patient with mild                         systemic disease.                        - Prior to the procedure, a History and Physical was                         performed, and patient medications, allergies and                         sensitivities were reviewed. The patient's tolerance                         of previous anesthesia was reviewed.                        - The risks and benefits of the procedure and the                         sedation options and risks were discussed with the                         patient. All questions were answered and informed                         consent was obtained.                        - Patient identification and proposed procedure were                         verified prior to the procedure by the physician, the                         nurse, the anesthesiologist, the anesthetist and the                         technician. The procedure was verified in the                         procedure  room.                        After obtaining informed consent, the colonoscope was                         passed under direct vision. Throughout the procedure,                         the patient's blood pressure, pulse, and oxygen                         saturations were  monitored continuously. The                         Colonoscope was introduced through the anus and                         advanced to the the cecum, identified by appendiceal                         orifice and ileocecal valve. The colonoscopy was                         performed with ease. The patient tolerated the                         procedure well. The quality of the bowel preparation                         was good. Findings:      The perianal and digital rectal examinations were normal.      A 4 mm polyp was found in the rectum. The polyp was sessile. The polyp       was removed with a cold snare. Resection and retrieval were complete.      The exam was otherwise without abnormality.      The rectum, sigmoid colon, descending colon, transverse colon, ascending       colon and cecum appeared normal.      Non-bleeding internal hemorrhoids were found during retroflexion. Impression:            - One 4 mm polyp in the rectum, removed with a cold                         snare. Resected and retrieved.                        - The examination was otherwise normal.                        - The rectum, sigmoid colon, descending colon,                         transverse colon, ascending colon and cecum are normal.                        - Non-bleeding internal hemorrhoids. Recommendation:        - Discharge patient to home (with escort).                        -  Advance diet as tolerated.                        - Continue present medications.                        - Await pathology results.                        - Repeat colonoscopy date to be determined after                         pending pathology results are reviewed.                        - The findings and recommendations were discussed with                         the patient.                        - The findings and recommendations were discussed with                         the patient's family.                         - Return to primary care physician as previously                         scheduled.                        - High fiber diet. Procedure Code(s):     --- Professional ---                        [phone], Colonoscopy, flexible; with removal of                         tumor(s), polyp(s), or other lesion(s) by snare                         technique Diagnosis Code(s):     --- Professional ---                        Z12.11, Encounter for screening for malignant neoplasm                         of colon                        K62.1, Rectal polyp CPT copyright 2019 [employer]. All rights reserved. The codes documented in this report are preliminary and upon coder review may  be revised to meet current compliance requirements.  Melodie Bouillon, MD Michel Bickers B. Maximino Greenland MD, MD 03/01/2021 1:11:32 PM This report has been signed electronically. Number of Addenda: 0 Note Initiated On: 03/01/2021 11:47 AM Scope Withdrawal Time: 0 hours 16 minutes 0 seconds  Total Procedure Duration: 0 hours 23 minutes 28 seconds  Estimated Blood Loss:  Estimated blood loss: none.      [facility]

## 2021-03-02 ENCOUNTER — Encounter: Payer: Self-pay | Admitting: Gastroenterology

## 2021-03-02 LAB — SURGICAL PATHOLOGY

## 2021-03-10 ENCOUNTER — Other Ambulatory Visit: Payer: Self-pay

## 2021-03-10 ENCOUNTER — Ambulatory Visit
Admission: RE | Admit: 2021-03-10 | Discharge: 2021-03-10 | Disposition: A | Discharge status: Home / Self Care | Payer: 59 | Source: Ambulatory Visit | Attending: Podiatry | Admitting: Podiatry

## 2021-03-10 DIAGNOSIS — M19071 Primary osteoarthritis, right ankle and foot: Secondary | ICD-10-CM | POA: Insufficient documentation

## 2021-03-23 ENCOUNTER — Encounter: Payer: Self-pay | Admitting: Gastroenterology

## 2021-04-09 ENCOUNTER — Encounter: Payer: Self-pay | Admitting: Podiatry

## 2021-04-09 ENCOUNTER — Other Ambulatory Visit: Payer: Self-pay

## 2021-04-09 ENCOUNTER — Ambulatory Visit: Payer: 59 | Admitting: Podiatry

## 2021-04-09 DIAGNOSIS — M19071 Primary osteoarthritis, right ankle and foot: Secondary | ICD-10-CM | POA: Diagnosis not present

## 2021-04-09 DIAGNOSIS — M722 Plantar fascial fibromatosis: Secondary | ICD-10-CM | POA: Diagnosis not present

## 2021-04-09 DIAGNOSIS — M79675 Pain in left toe(s): Secondary | ICD-10-CM

## 2021-04-09 DIAGNOSIS — L600 Ingrowing nail: Secondary | ICD-10-CM | POA: Diagnosis not present

## 2021-04-09 MED ORDER — MELOXICAM 15 MG PO TABS: 15.0000 mg | ORAL_TABLET | Freq: Every day | ORAL | 0 refills | Status: DC

## 2021-04-09 NOTE — Progress Notes (Signed)
Subjective: 60 year old male presents the office today for follow-up evaluation of right foot swelling.  He states that the area swells up with pressure to the foot he will feel pins-and-needles mostly towards the end of the day as it swells more.  No rating pain or weakness.  No falls.  Also concerned that the left fifth toenail on the medial aspect is causing discomfort.  He does try to cut this out himself however comes back causing discomfort.  No redness or swelling or any drainage he reports.  Denies any systemic complaints such as fevers, chills, nausea, vomiting. No acute changes since last appointment, and no other complaints at this time.   Objective: AAO x3, NAD DP/PT pulses palpable bilaterally, CRT less than 3 seconds Incurvation present to the medial aspect left fifth digit toenail nails mildly hypertrophic, dystrophic and discolored.  No edema, erythema and signs of infection. Dorsal spurring present the dorsal medial midfoot on the right side.  No area pinpoint tenderness.  Trace edema.  No erythema or warmth.  Flexor, extensor tendons appear to be intact.  MMT 5/5. No pain with calf compression, swelling, warmth, erythema  Assessment: Right foot capsulitis, arthritis; left fifth digit ingrown toenail  Plan: -All treatment options discussed with the patient including all alternatives, risks, complications.  -I reviewed the MRI with him.  I do think that some of the mild arthritis is causing some swelling.  Discussed releasing issues to avoid any pressure.  It seemed that the nerve symptoms started with the swelling and I do not think he has underlying nerve issue but if symptoms progress or worsen can refer to neurology.  Meloxicam as needed. -At this time, the patient is requesting partial nail removal with chemical matricectomy to the symptomatic portion of the nail. Risks and complications were discussed with the patient for which they understand and written consent was  obtained. Under sterile conditions a total of 3 mL of a mixture of 2% lidocaine plain and 0.5% Marcaine plain was infiltrated in a digital block fashion. Once anesthetized, the skin was prepped in sterile fashion. A tourniquet was then applied. Next the medial aspect of fifth digit nail border was then sharply excised making sure to remove the entire offending nail border. Once the nails were ensured to be removed area was debrided and the underlying skin was intact. There is no purulence identified in the procedure. Next phenol was then applied under standard conditions and copiously irrigated. Silvadene was applied. A dry sterile dressing was applied. After application of the dressing the tourniquet was removed and there is found to be an immediate capillary refill time to the digit. The patient tolerated the procedure well any complications. Post procedure instructions were discussed the patient for which he verbally understood. Follow-up in one week for nail check or sooner if any problems are to arise. Discussed signs/symptoms of infection and directed to call the office immediately should any occur or go directly to the emergency room. In the meantime, encouraged to call the office with any questions, concerns, changes symptoms. -Patient encouraged to call the office with any questions, concerns, change in symptoms.   Vivi Barrack DPM

## 2021-04-09 NOTE — Patient Instructions (Signed)

## 2021-04-14 IMAGING — MR MR FOOT*R* W/O CM
7 series · 40 of 40 positions shown · non-contrast
Comparison: None.

CLINICAL DATA: Chronic foot pain. Ongoing for 3 years.

EXAM:
MRI OF THE RIGHT FOREFOOT WITHOUT CONTRAST
TECHNIQUE: Multiplanar, multisequence MR imaging of the right forefoot was
performed. No intravenous contrast was administered.

[Series 4: T1 · coronal · right · 3.0mm · 0.38mm/px · 9 of 55 slices shown (1 of 2)]
[im 1/55]
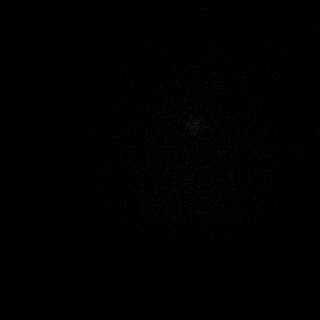
[im 7/55]
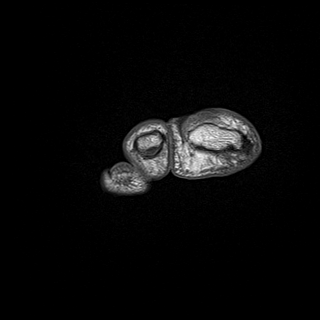
[im 14/55]
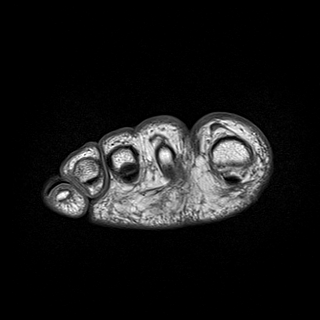
[im 21/55]
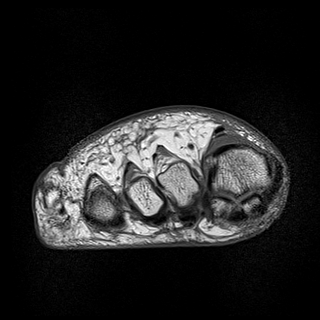
[im 28/55]
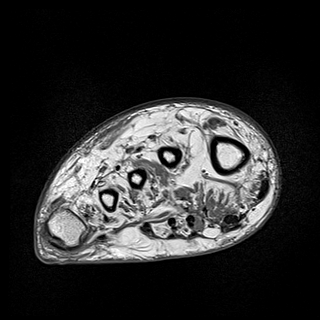
[im 34/55]
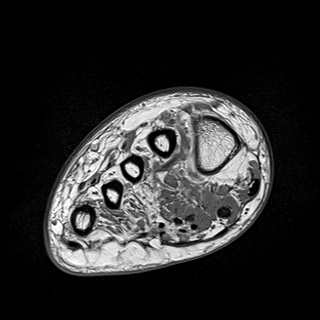
[im 41/55]
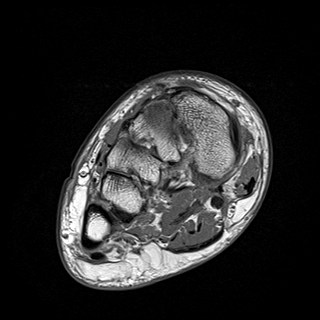
[im 48/55]
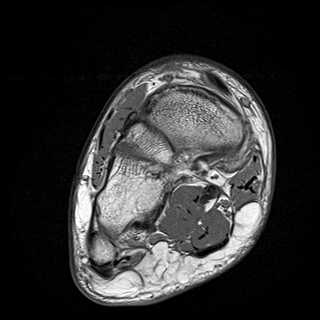
[im 55/55]
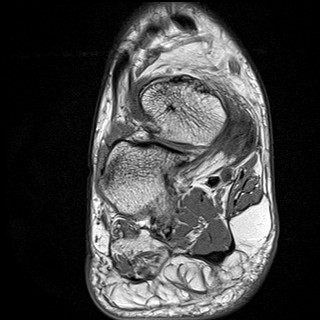

[Series 5: T2 · coronal · right · 3.0mm · 0.38mm/px · 9 of 55 slices shown (1 of 4)]
[im 1/55]
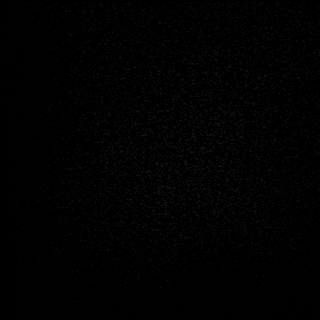
[im 7/55]
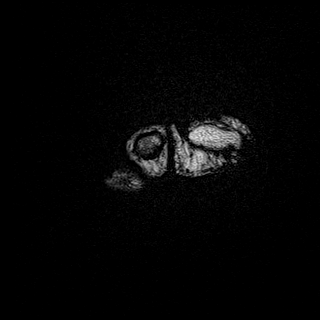
[im 14/55]
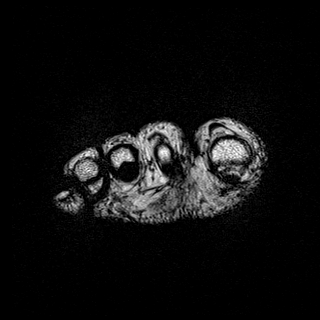
[im 21/55]
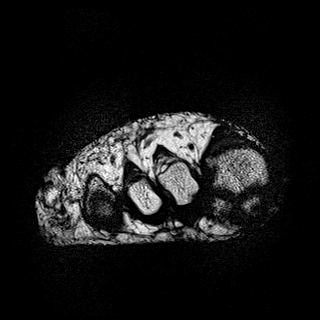
[im 28/55]
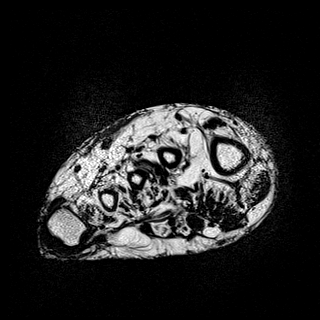
[im 34/55]
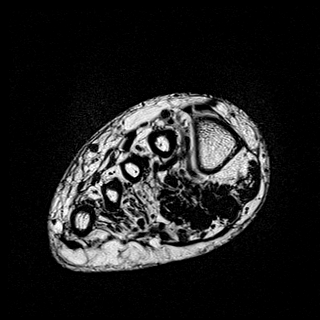
[im 41/55]
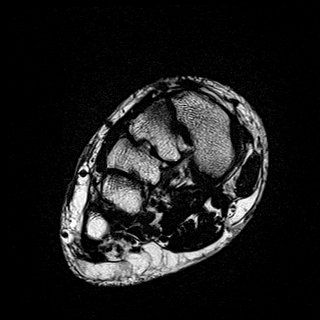
[im 48/55]
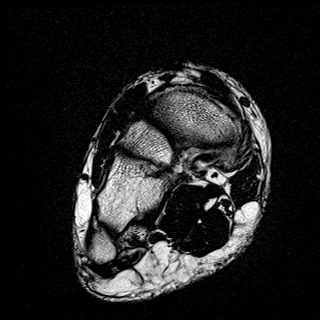
[im 55/55]
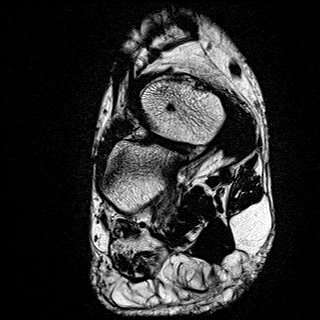

[Series 6: T2 · coronal · right · 3.0mm · 0.38mm/px · 9 of 55 slices shown (2 of 4)]
[im 1/55]
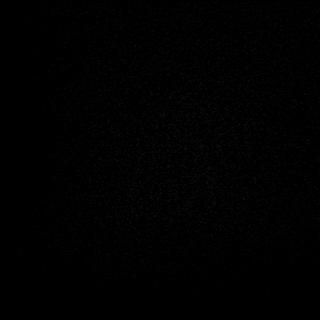
[im 7/55]
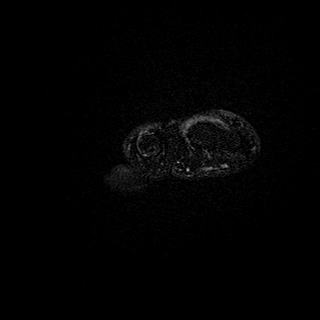
[im 14/55]
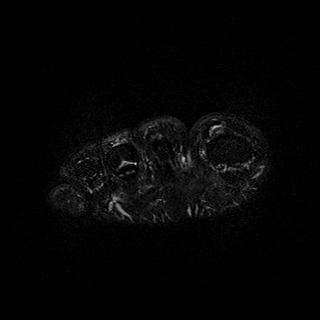
[im 21/55]
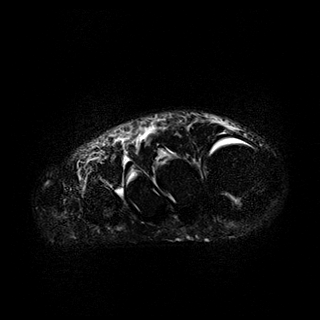
[im 28/55]
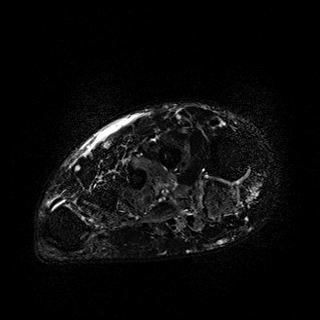
[im 34/55]
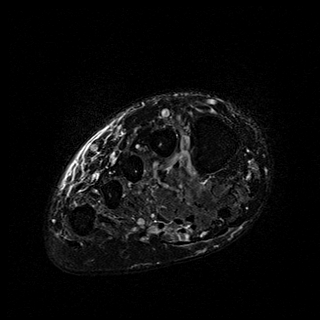
[im 41/55]
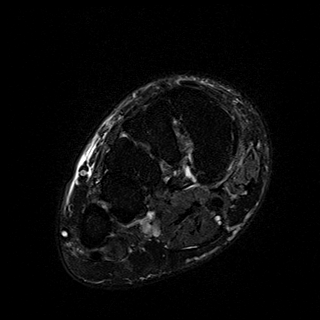
[im 48/55]
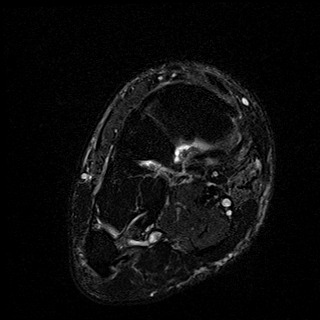
[im 55/55]
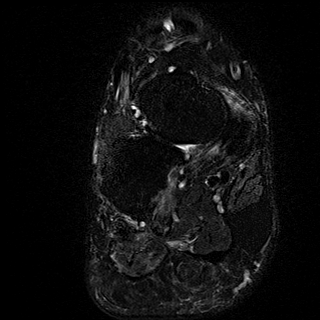

[Series 7: T1 · axial · right · 3.0mm · 0.70mm/px · z∈[-134,-56]mm · 3 of 22 slices shown (2 of 2)]
[im 1/22]
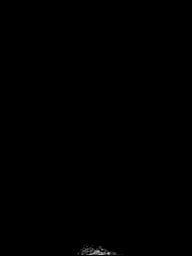
[im 11/22]
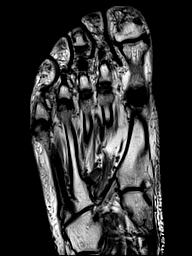
[im 22/22]
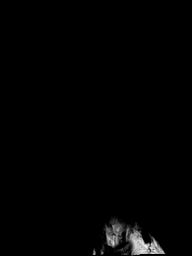

[Series 8: T2 · axial · right · 3.0mm · 0.70mm/px · z∈[-134,-56]mm · 3 of 22 slices shown (3 of 4)]
[im 1/22]
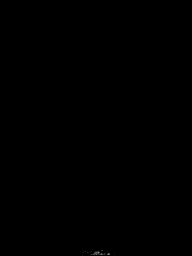
[im 11/22]
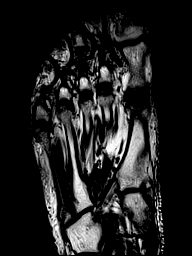
[im 22/22]
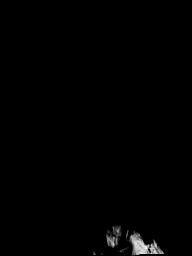

[Series 9: T2 · axial · right · 3.0mm · 0.70mm/px · z∈[-134,-56]mm · 3 of 22 slices shown (4 of 4)]
[im 1/22]
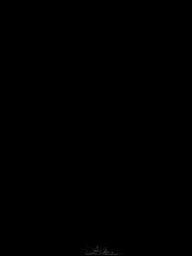
[im 11/22]
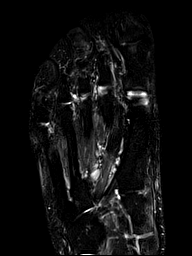
[im 22/22]
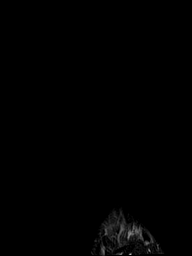

[Series 10: STIR · sagittal · right · 3.0mm · 0.62mm/px · 4 of 29 slices shown]
[im 1/29]
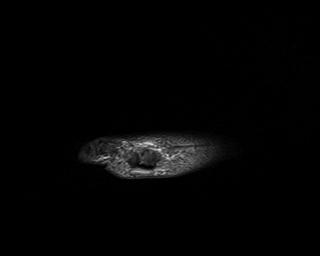
[im 10/29]
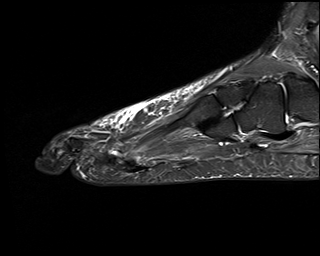
[im 19/29]
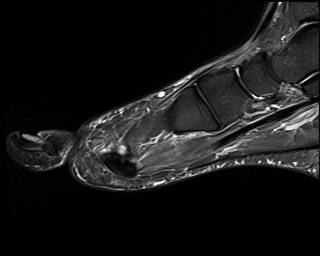
[im 29/29]
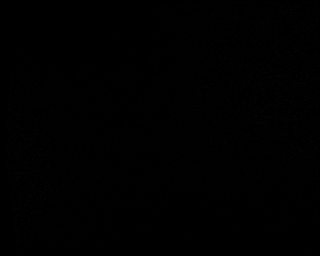

[40 of 40 positions shown; findings below may reference images not displayed]

FINDINGS: Bones/Joint/Cartilage

No fracture or dislocation. Normal alignment. No joint effusion.

Moderate osteoarthritis of the first MTP joint. Mild osteoarthritis
of the first IP joint. Small first MTP joint effusion.

Mild osteoarthritis of the talonavicular join.

No significant arthropathy of the tarsometatarsal joint.

Ligaments

Collateral ligaments are intact. Lisfranc ligament is intact.

Muscles and Tendons
Flexor, peroneal and extensor compartment tendons are intact.
Muscles are normal.

Soft tissue
No fluid collection or hematoma. No soft tissue mass.
IMPRESSION: 1. Moderate osteoarthritis of the first MTP joint.
2. Mild osteoarthritis of the first IP joint.

## 2021-04-24 ENCOUNTER — Ambulatory Visit: Payer: 59 | Admitting: Podiatry

## 2021-04-30 ENCOUNTER — Other Ambulatory Visit: Payer: Self-pay

## 2021-04-30 ENCOUNTER — Encounter: Payer: Self-pay | Admitting: Family Medicine

## 2021-04-30 ENCOUNTER — Ambulatory Visit: Payer: 59 | Admitting: Podiatry

## 2021-04-30 ENCOUNTER — Ambulatory Visit (INDEPENDENT_AMBULATORY_CARE_PROVIDER_SITE_OTHER): Payer: 59 | Admitting: Family Medicine

## 2021-04-30 VITALS — BP 136/80 | HR 87 | Temp 98.6°F | Resp 16 | Ht 73.0 in | Wt 253.0 lb

## 2021-04-30 DIAGNOSIS — Z23 Encounter for immunization: Secondary | ICD-10-CM | POA: Diagnosis not present

## 2021-04-30 DIAGNOSIS — Z0000 Encounter for general adult medical examination without abnormal findings: Secondary | ICD-10-CM | POA: Diagnosis not present

## 2021-04-30 NOTE — Progress Notes (Signed)
Name: Willie Atkins   MRN: [id number]    DOB: [birth year]   Date:04/30/2021       Progress Note  Subjective  Chief Complaint  Annual Exam  HPI  Patient presents for annual CPE   IPSS Questionnaire (AUA-7): Over the past month.   1)  How often have you had a sensation of not emptying your bladder completely after you finish urinating?  0 - Not at all  2)  How often have you had to urinate again less than two hours after you finished urinating? 0 - Not at all  3)  How often have you found you stopped and started again several times when you urinated?  0 - Not at all  4) How difficult have you found it to postpone urination?  0 - Not at all  5) How often have you had a weak urinary stream?  0 - Not at all  6) How often have you had to push or strain to begin urination?  0 - Not at all  7) How many times did you most typically get up to urinate from the time you went to bed until the time you got up in the morning?  1 - 1 time  Total score:  0-7 mildly symptomatic   8-19 moderately symptomatic   20-35 severely symptomatic     Diet: he drinks a fruit smoothie for breakfast , lunch is mostly fast food - tries to make good choices, discussed packing lunch and going to a park , eats dinner at home  Exercise: continue regular physical activity   Depression: phq 9 is negative Depression screen [facility] 2/9 04/30/2021 02/02/2021 03/18/2019 02/04/2018 05/07/2017  Decreased Interest 0 0 0 0 0  Down, Depressed, Hopeless 0 0 0 1 0  PHQ - 2 Score 0 0 0 1 0  Altered sleeping - - 1 - -  Tired, decreased energy - - 0 - -  Change in appetite - - 0 - -  Feeling bad or failure about yourself  - - 0 - -  Trouble concentrating - - 0 - -  Moving slowly or fidgety/restless - - 0 - -  Suicidal thoughts - - 0 - -  PHQ-9 Score - - 1 - -    Hypertension:  BP Readings from Last 3 Encounters:  04/30/21 136/80  03/01/21 105/72  02/02/21 132/84    Obesity: Wt Readings from Last 3 Encounters:  04/30/21 253 lb  (114.8 kg)  03/01/21 244 lb (110.7 kg)  02/02/21 252 lb (114.3 kg)   BMI Readings from Last 3 Encounters:  04/30/21 33.38 kg/m  03/01/21 32.19 kg/m  02/02/21 33.25 kg/m     Lipids:  Lab Results  Component Value Date   CHOL 147 02/05/2021   CHOL 164 03/18/2019   CHOL 136 02/04/2018   Lab Results  Component Value Date   HDL 46 02/05/2021   HDL 43 03/18/2019   HDL 53 02/04/2018   Lab Results  Component Value Date   LDLCALC 78 02/05/2021   LDLCALC 100 (H) 03/18/2019   LDLCALC 67 02/04/2018   Lab Results  Component Value Date   TRIG 130 02/05/2021   TRIG 112 03/18/2019   TRIG 78 02/04/2018   Lab Results  Component Value Date   CHOLHDL 3.2 02/05/2021   CHOLHDL 3.8 03/18/2019   CHOLHDL 2.6 02/04/2018   No results found for: LDLDIRECT Glucose:  Glucose  Date Value Ref Range Status  02/04/2018 86 65 - 99 mg/dL  Final   Glucose, Bld  Date Value Ref Range Status  02/05/2021 94 65 - 99 mg/dL Final    Comment:    .            Fasting reference interval .   03/18/2019 92 65 - 99 mg/dL Final    Comment:    .            Fasting reference interval .   05/07/2017 91 65 - 99 mg/dL Final    Flowsheet Row Office Visit from 04/30/2021 in [facility]  AUDIT-C Score 4      Married STD testing and prevention (HIV/chl/gon/syphilis):  N/A Hep C: 07/17/12  Skin cancer: Discussed monitoring for atypical lesions Colorectal cancer: 03/01/21 Prostate cancer:  Lab Results  Component Value Date   PSA 0.41 02/05/2021   PSA 0.7 03/18/2019   PSA 0.5 normal 01/25/2014     Lung cancer: Low Dose CT Chest recommended if Age 57-80 years, 30 pack-year currently smoking OR have quit w/in 15years. Patient does,not qualify.   AAA: The USPSTF recommends one-time screening with ultrasonography in men ages 30 to 59 years who have ever smoked ECG:  09/12/14  Vaccines: Shingrix: 71-64 yo and ask insurance if covered when patient above 65 yo Pneumonia: educated  and discussed with patient. Flu: educated and discussed with patient.  Advanced Care Planning: A voluntary discussion about advance care planning including the explanation and discussion of advance directives.  Discussed health care proxy and Living will, and the patient was able to identify a health care proxy as wife .   Patient Active Problem List   Diagnosis Date Noted  . Encounter for screening colonoscopy   . Polyp of colon   . Breast tenderness in male 08/31/2015  . Gynecomastia, male 08/31/2015  . Allergic rhinitis 08/15/2015  . Mitral valve disorder 08/15/2015  . 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 Dyslipidemia 08/14/2015  . Hypogonadism male 08/14/2015  . Metabolic syndrome 08/14/2015  . Depression with anxiety 08/14/2015  . OSA on CPAP 08/14/2015  . S/P arthroscopic surgery of left knee 08/14/2015  . Umbilical hernia without obstruction and without gangrene 08/14/2015  . Left Achilles tendinitis 08/14/2015    Past Surgical History:  Procedure Laterality Date  . COLONOSCOPY WITH PROPOFOL N/A 03/01/2021   Procedure: COLONOSCOPY WITH PROPOFOL;  Surgeon: Pasty Spillers, MD;  Location: [facility] ENDOSCOPY;  Service: Endoscopy;  Laterality: N/A;  . KNEE SURGERY Left 08/2014  . VASECTOMY      Family History  Problem Relation Age of Onset  . Cancer Mother   . Heart disease Father   . Stroke Father     Social History   Socioeconomic History  . Marital status: Married    Spouse name: [name]   . Number of children: 2  . Years of education: Not on file  . Highest education level: Associate degree: academic program  Occupational History  . Occupation: Production assistant, radio   Tobacco Use  . Smoking status: Never Smoker  . Smokeless tobacco: Never Used  Vaping Use  . Vaping Use: Never used  Substance and Sexual Activity  . Alcohol use: Yes    Alcohol/week: 2.0 standard drinks    Types: 2 Glasses of wine per week    Comment: Wine with dinner nightly  .  Drug use: No  . Sexual activity: Never  Other Topics Concern  . Not on file  Social History Narrative  . Not on file   Social  Determinants of Health   Financial Resource Strain: Low Risk   . Difficulty of Paying Living Expenses: Not hard at all  Food Insecurity: No Food Insecurity  . Worried About Programme researcher, broadcasting/film/video in the Last Year: Never true  . Ran Out of Food in the Last Year: Never true  Transportation Needs: No Transportation Needs  . Lack of Transportation (Medical): No  . Lack of Transportation (Non-Medical): No  Physical Activity: Sufficiently Active  . Days of Exercise per Week: 7 days  . Minutes of Exercise per Session: 30 min  Stress: Stress Concern Present  . Feeling of Stress : To some extent  Social Connections: Socially Integrated  . Frequency of Communication with Friends and Family: Three times a week  . Frequency of Social Gatherings with Friends and Family: More than three times a week  . Attends Religious Services: More than 4 times per year  . Active Member of Clubs or Organizations: Yes  . Attends Banker Meetings: More than 4 times per year  . Marital Status: Married  Catering manager Violence: Not At Risk  . Fear of Current or Ex-Partner: No  . Emotionally Abused: No  . Physically Abused: No  . Sexually Abused: No     Current Outpatient Medications:  .  cyanocobalamin 1000 MCG tablet, Take 1,000 mcg by mouth daily., Disp: , Rfl:  .  ibuprofen (ADVIL) 200 MG tablet, Take 400 mg by mouth every 6 (six) hours as needed., Disp: , Rfl:  .  meloxicam (MOBIC) 15 MG tablet, Take 1 tablet (15 mg total) by mouth daily., Disp: 30 tablet, Rfl: 0  No Known Allergies   ROS  Constitutional: Negative for fever or weight change.  Respiratory: Negative for cough and shortness of breath.   Cardiovascular: Negative for chest pain or palpitations.  Gastrointestinal: Negative for abdominal pain, no bowel changes.  Musculoskeletal: Negative for gait  problem or joint swelling.  Skin: Negative for rash.  Neurological: Negative for dizziness or headache.  No other specific complaints in a complete review of systems (except as listed in HPI above).   Objective  Vitals:   04/30/21 1359  BP: 136/80  Pulse: 87  Resp: 16  Temp: 98.6 F (37 C)  TempSrc: Oral  SpO2: 98%  Weight: 253 lb (114.8 kg)  Height: 6\' 1"  (1.854 m)    Body mass index is 33.38 kg/m.  Physical Exam  Constitutional: Patient appears well-developed and well-nourished. No distress.  HENT: Head: Normocephalic and atraumatic. Ears: B TMs ok, no erythema or effusion; Nose: Not done  Mouth/Throat: not done  Eyes: Conjunctivae and EOM are normal. Pupils are equal, round, and reactive to light. No scleral icterus.  Neck: Normal range of motion. Neck supple. No JVD present. No thyromegaly present.  Cardiovascular: Normal rate, regular rhythm and normal heart sounds.  No murmur heard. No BLE edema. Pulmonary/Chest: Effort normal and breath sounds normal. No respiratory distress. Abdominal: Soft. Bowel sounds are normal, no distension. There is no tenderness. no masses MALE GENITALIA: Normal descended testes bilaterally, no masses palpated, no hernias, no lesions, no discharge RECTAL: not done  Musculoskeletal: Normal range of motion, no joint effusions. No gross deformities Neurological: he is alert and oriented to person, place, and time. No cranial nerve deficit. Coordination, balance, strength, speech and gait are normal.  Skin: Skin is warm and dry. No rash noted. No erythema.  Psychiatric: Patient has a normal mood and affect. behavior is normal. Judgment and thought content normal.  Recent Results (from the past 2160 hour(s))  Lipid panel     Status: None   Collection Time: 02/05/21  8:58 AM  Result Value Ref Range   Cholesterol 147 <200 mg/dL   HDL 46 > OR = 40 mg/dL   Triglycerides 161 <096 mg/dL   LDL Cholesterol (Calc) 78 mg/dL (calc)    Comment:  Reference range: <100 . Desirable range <100 mg/dL for primary prevention;   <70 mg/dL for patients with CHD or diabetic patients  with > or = 2 CHD risk factors. Marland Kitchen LDL-C is now calculated using the Martin-Hopkins  calculation, which is a validated novel method providing  better accuracy than the Friedewald equation in the  estimation of LDL-C.  Horald Pollen et al. Lenox Ahr. 0454;098(11): 2061-2068  (http://education.[facility].com/faq/FAQ164)    Total CHOL/HDL Ratio 3.2 <5.0 (calc)   Non-HDL Cholesterol (Calc) 101 <130 mg/dL (calc)    Comment: For patients with diabetes plus 1 major ASCVD risk  factor, treating to a non-HDL-C goal of <100 mg/dL  (LDL-C of <91 mg/dL) is considered a therapeutic  option.   CBC with Differential/Platelet     Status: None   Collection Time: 02/05/21  8:58 AM  Result Value Ref Range   WBC 6.3 3.8 - 10.8 Thousand/uL   RBC 5.55 4.20 - 5.80 Million/uL   Hemoglobin 16.3 13.2 - 17.1 g/dL   HCT 47.8 29.5 - 62.1 %   MCV 85.9 80.0 - 100.0 fL   MCH 29.4 27.0 - 33.0 pg   MCHC 34.2 32.0 - 36.0 g/dL   RDW 30.8 65.7 - 84.6 %   Platelets 189 140 - 400 Thousand/uL   MPV 10.4 7.5 - 12.5 fL   Neutro Abs 3,421 1,500 - 7,800 cells/uL   Lymphs Abs 2,318 850 - 3,900 cells/uL   Absolute Monocytes 391 200 - 950 cells/uL   Eosinophils Absolute 132 15 - 500 cells/uL   Basophils Absolute 38 0 - 200 cells/uL   Neutrophils Relative % 54.3 %   Total Lymphocyte 36.8 %   Monocytes Relative 6.2 %   Eosinophils Relative 2.1 %   Basophils Relative 0.6 %  COMPLETE METABOLIC PANEL WITH GFR     Status: None   Collection Time: 02/05/21  8:58 AM  Result Value Ref Range   Glucose, Bld 94 65 - 99 mg/dL    Comment: .            Fasting reference interval .    BUN 19 7 - 25 mg/dL   Creat 9.62 9.52 - 8.41 mg/dL    Comment: For patients >41 years of age, the reference limit for Creatinine is approximately 13% higher for people identified as African-American. .    GFR, Est  Non African American 75 > OR = 60 mL/min/1.39m2   GFR, Est African American 87 > OR = 60 mL/min/1.39m2   BUN/Creatinine Ratio NOT APPLICABLE 6 - 22 (calc)   Sodium 141 135 - 146 mmol/L   Potassium 4.7 3.5 - 5.3 mmol/L   Chloride 105 98 - 110 mmol/L   CO2 30 20 - 32 mmol/L   Calcium 9.5 8.6 - 10.3 mg/dL   Total Protein 7.2 6.1 - 8.1 g/dL   Albumin 4.3 3.6 - 5.1 g/dL   Globulin 2.9 1.9 - 3.7 g/dL (calc)   AG Ratio 1.5 1.0 - 2.5 (calc)   Total Bilirubin 1.1 0.2 - 1.2 mg/dL   Alkaline phosphatase (APISO) 55 35 - 144 U/L   AST 19 10 - 35  U/L   ALT 21 9 - 46 U/L  Vitamin B12     Status: None   Collection Time: 02/05/21  8:58 AM  Result Value Ref Range   Vitamin B-12 512 200 - 1,100 pg/mL  PSA     Status: None   Collection Time: 02/05/21  8:58 AM  Result Value Ref Range   PSA 0.41 < OR = 4.0 ng/mL    Comment: The total PSA value from this assay system is  standardized against the WHO standard. The test  result will be approximately 20% lower when compared  to the equimolar-standardized total PSA (Beckman  Coulter). Comparison of serial PSA results should be  interpreted with this fact in mind. . This test was performed using the Siemens  chemiluminescent method. Values obtained from  different assay methods cannot be used interchangeably. PSA levels, regardless of value, should not be interpreted as absolute evidence of the presence or absence of disease.   Hemoglobin A1c     Status: None   Collection Time: 02/05/21  8:58 AM  Result Value Ref Range   Hgb A1c MFr Bld 5.2 <5.7 % of total Hgb    Comment: For the purpose of screening for the presence of diabetes: . <5.7%       Consistent with the absence of diabetes 5.7-6.4%    Consistent with increased risk for diabetes             (prediabetes) > or =6.5%  Consistent with diabetes . This assay result is consistent with a decreased risk of diabetes. . Currently, no consensus exists regarding use of hemoglobin A1c for  diagnosis of diabetes in children. . According to American Diabetes Association (ADA) guidelines, hemoglobin A1c <7.0% represents optimal control in non-pregnant diabetic patients. Different metrics may apply to specific patient populations.  Standards of Medical Care in Diabetes(ADA). .    Mean Plasma Glucose 103 mg/dL   eAG (mmol/L) 5.7 mmol/L  Testosterone , Free and Total     Status: Abnormal   Collection Time: 02/05/21  8:58 AM  Result Value Ref Range   Testosterone, Total, LC-MS-MS 164 (L) 250 - 1,100 ng/dL    Comment: Men with clinically significant hypogonadal symptoms and testosterone values repeatedly in the range of the 200-300 ng/dL or less, may benefit from testerone treatment after adequate risk and benefits counseling. . For additional information, please refer to https://education.[facility].com/faq/FAQ165 (This link is being provided for informational/educational purposes only.) (Note) . This test was developed and its analytical performance characteristics have been determined by [facility]. It has not been cleared or approved by the FDA. This assay has been validated pursuant to the CLIA regulations and is used for clinical purposes. . Tandy Gaw Testosterone 32.8 (L) 35.0 - 155.0 pg/mL    Comment: (Note) This test was developed and its analytical performance  characteristics have been determined by [facility]. It has not been  cleared or approved by the FDA. This assay has been validated  pursuant to the CLIA regulations and is used for clinical purposes. . [facility] [address] [place] [address] [phone] Eulah Pont, MD   SARS CORONAVIRUS 2 (TAT 6-24 HRS) Nasopharyngeal Nasopharyngeal Swab     Status: None   Collection Time: 02/27/21 11:25 AM   Specimen: Nasopharyngeal Swab  Result Value Ref Range   SARS Coronavirus 2 NEGATIVE NEGATIVE    Comment: (NOTE) SARS-CoV-2 target nucleic acids are NOT  DETECTED.  The SARS-CoV-2 RNA is  generally detectable in upper and lower respiratory specimens during the acute phase of infection. Negative results do not preclude SARS-CoV-2 infection, do not rule out co-infections with other pathogens, and should not be used as the sole basis for treatment or other patient management decisions. Negative results must be combined with clinical observations, patient history, and epidemiological information. The expected result is Negative.  Fact Sheet for Patients: HairSlick.nohttps://www.fda.gov/media/138098/download  Fact Sheet for Healthcare Providers: quierodirigir.comhttps://www.fda.gov/media/138095/download  This test is not yet approved or cleared by the [place] FDA and  has been authorized for detection and/or diagnosis of SARS-CoV-2 by FDA under an Emergency Use Authorization (EUA). This EUA will remain  in effect (meaning this test can be used) for the duration of the COVID-19 declaration under Se ction 564(b)(1) of the Act, 21 U.S.C. section 360bbb-3(b)(1), unless the authorization is terminated or revoked sooner.  Performed at [facility], [address] [place], [place] [address]   Surgical pathology     Status: None   Collection Time: 03/01/21 12:37 PM  Result Value Ref Range   SURGICAL PATHOLOGY      SURGICAL PATHOLOGY CASE: [id number] PATIENT: Laurine BlazerJOHN Remedios Surgical Pathology Report     Specimen Submitted: A. Rectum polyp; cold snare  Clinical History: Screening colonoscopy.  Colon polyp    DIAGNOSIS: A. RECTAL POLYP; COLD SNARE: - HYPERPLASTIC POLYP. - NEGATIVE FOR DYSPLASIA AND MALIGNANCY.  GROSS DESCRIPTION: A. Labeled: Cold snared rectal polyp Received: Formalin Collection time: 12:37 PM on 03/01/2021 Placed into formalin time: 12:37 PM on 03/01/2021 Tissue fragment(s): 1 Size: 0.5 x 0.3 x 0.2 cm Description: Tan soft tissue fragment Entirely submitted in 1 cassette.  RB 03/01/2021   Final Diagnosis performed by  Katherine MantleHeath Jones, MD.   Electronically signed 03/02/2021 10:10:01AM The electronic signature indicates that the named Attending Pathologist has evaluated the specimen Technical component performed at [facility], [address], [place], [place] [address] Lab: [phone] Dir: Jolene SchimkeSanjai Nagendra, MD, MMM  Professional component performed at [facility] , [facility], [address], [place], [place] [address] Lab: [phone] Dir: Georgiann Cockerara C. Rubinas, MD      Fall Risk: Fall Risk  04/30/2021 02/02/2021 03/18/2019 02/04/2018 11/03/2017  Falls in the past year? 0 0 0 No No  Number falls in past yr: 0 0 0 - -  Injury with Fall? 0 0 0 - -      Functional Status Survey: Is the patient deaf or have difficulty hearing?: No Does the patient have difficulty seeing, even when wearing glasses/contacts?: No Does the patient have difficulty concentrating, remembering, or making decisions?: No Does the patient have difficulty walking or climbing stairs?: No Does the patient have difficulty dressing or bathing?: No Does the patient have difficulty doing errands alone such as visiting a doctor's office or shopping?: No    Assessment & Plan  1. Well adult exam   2. Need for shingles vaccine  - Varicella-zoster vaccine IM   -Prostate cancer screening and PSA options (with potential risks and benefits of testing vs not testing) were discussed along with recent recs/guidelines. -USPSTF grade A and B recommendations reviewed with patient; age-appropriate recommendations, preventive care, screening tests, etc discussed and encouraged; healthy living encouraged; see AVS for patient education given to patient -Discussed importance of 150 minutes of physical activity weekly, eat two servings of fish weekly, eat one serving of tree nuts ( cashews, pistachios, pecans, almonds.Marland Kitchen.) every other day, eat 6 servings of fruit/vegetables daily and drink plenty of water and avoid  sweet beverages.

## 2021-04-30 NOTE — Patient Instructions (Signed)

## 2021-05-16 ENCOUNTER — Other Ambulatory Visit: Payer: Self-pay | Admitting: Podiatry

## 2021-05-16 NOTE — Telephone Encounter (Signed)
Please advise 

## 2021-06-20 ENCOUNTER — Other Ambulatory Visit: Payer: Self-pay

## 2021-06-20 ENCOUNTER — Ambulatory Visit: Payer: Self-pay | Admitting: *Deleted

## 2021-06-20 ENCOUNTER — Encounter: Payer: Self-pay | Admitting: Family Medicine

## 2021-06-20 ENCOUNTER — Telehealth (INDEPENDENT_AMBULATORY_CARE_PROVIDER_SITE_OTHER): Payer: 59 | Admitting: Family Medicine

## 2021-06-20 VITALS — Ht 74.0 in | Wt 253.0 lb

## 2021-06-20 DIAGNOSIS — U071 COVID-19: Secondary | ICD-10-CM

## 2021-06-20 MED ORDER — NIRMATRELVIR/RITONAVIR (PAXLOVID)TABLET
3.0000 | ORAL_TABLET | Freq: Two times a day (BID) | ORAL | 0 refills | Status: AC
  Filled 2021-06-20: qty 30, 5d supply, fill #0

## 2021-06-20 NOTE — Patient Instructions (Signed)
It was great to see you!  Our plans for today:  - See below for self-isolation guidelines. You may end your quarantine once you are 5 days from symptom onset and fever free for 24 hours without use of tylenol or ibuprofen. Wear a well-fitting N95 mask for an additional 5 days. - Take the paxlovid as directed. See [email or web address].[email or web address] for more information about the medication. - Certainly, if you are having difficulties breathing or unable to keep down fluids, go to the Emergency Department.   Take care and seek immediate care sooner if you develop any concerns.   Dr. Linwood Dibbles     Person Under Monitoring Name: Willie Atkins  Location: [address] [place] [address]   Infection Prevention Recommendations for Individuals Confirmed to have, or Being Evaluated for, 2019 Novel Coronavirus (COVID-19) Infection Who Receive Care at Home  Individuals who are confirmed to have, or are being evaluated for, COVID-19 should follow the prevention steps below until a healthcare provider or local or state health department says they can return to normal activities.  Stay home except to get medical care You should restrict activities outside your home, except for getting medical care. Do not go to work, school, or public areas, and do not use public transportation or taxis.  Call ahead before visiting your doctor Before your medical appointment, call the healthcare provider and tell them that you have, or are being evaluated for, COVID-19 infection. This will help the healthcare provider's office take steps to keep other people from getting infected. Ask your healthcare provider to call the local or state health department.  Monitor your symptoms Seek prompt medical attention if your illness is worsening (e.g., difficulty breathing). Before going to your medical appointment, call the healthcare provider and tell them that you have, or are being evaluated for, COVID-19  infection. Ask your healthcare provider to call the local or state health department.  Wear a facemask You should wear a facemask that covers your nose and mouth when you are in the same room with other people and when you visit a healthcare provider. People who live with or visit you should also wear a facemask while they are in the same room with you.  Separate yourself from other people in your home As much as possible, you should stay in a different room from other people in your home. Also, you should use a separate bathroom, if available.  Avoid sharing household items You should not share dishes, drinking glasses, cups, eating utensils, towels, bedding, or other items with other people in your home. After using these items, you should wash them thoroughly with soap and water.  Cover your coughs and sneezes Cover your mouth and nose with a tissue when you cough or sneeze, or you can cough or sneeze into your sleeve. Throw used tissues in a lined trash can, and immediately wash your hands with soap and water for at least 20 seconds or use an alcohol-based hand rub.  Wash your [employer] your hands often and thoroughly with soap and water for at least 20 seconds. You can use an alcohol-based hand sanitizer if soap and water are not available and if your hands are not visibly dirty. Avoid touching your eyes, nose, and mouth with unwashed hands.   Prevention Steps for Caregivers and Household Members of Individuals Confirmed to have, or Being Evaluated for, COVID-19 Infection Being Cared for in the Home  If you live with, or provide care at home for, a  person confirmed to have, or being evaluated for, COVID-19 infection please follow these guidelines to prevent infection:  Follow healthcare provider's instructions Make sure that you understand and can help the patient follow any healthcare provider instructions for all care.  Provide for the patient's basic needs You should  help the patient with basic needs in the home and provide support for getting groceries, prescriptions, and other personal needs.  Monitor the patient's symptoms If they are getting sicker, call his or her medical provider and tell them that the patient has, or is being evaluated for, COVID-19 infection. This will help the healthcare provider's office take steps to keep other people from getting infected. Ask the healthcare provider to call the local or state health department.  Limit the number of people who have contact with the patient If possible, have only one caregiver for the patient. Other household members should stay in another home or place of residence. If this is not possible, they should stay in another room, or be separated from the patient as much as possible. Use a separate bathroom, if available. Restrict visitors who do not have an essential need to be in the home.  Keep older adults, very young children, and other sick people away from the patient Keep older adults, very young children, and those who have compromised immune systems or chronic health conditions away from the patient. This includes people with chronic heart, lung, or kidney conditions, diabetes, and cancer.  Ensure good ventilation Make sure that shared spaces in the home have good air flow, such as from an air conditioner or an opened window, weather permitting.  Wash your hands often Wash your hands often and thoroughly with soap and water for at least 20 seconds. You can use an alcohol based hand sanitizer if soap and water are not available and if your hands are not visibly dirty. Avoid touching your eyes, nose, and mouth with unwashed hands. Use disposable paper towels to dry your hands. If not available, use dedicated cloth towels and replace them when they become wet.  Wear a facemask and gloves Wear a disposable facemask at all times in the room and gloves when you touch or have contact with the  patient's blood, body fluids, and/or secretions or excretions, such as sweat, saliva, sputum, nasal mucus, vomit, urine, or feces.  Ensure the mask fits over your nose and mouth tightly, and do not touch it during use. Throw out disposable facemasks and gloves after using them. Do not reuse. Wash your hands immediately after removing your facemask and gloves. If your personal clothing becomes contaminated, carefully remove clothing and launder. Wash your hands after handling contaminated clothing. Place all used disposable facemasks, gloves, and other waste in a lined container before disposing them with other household waste. Remove gloves and wash your hands immediately after handling these items.  Do not share dishes, glasses, or other household items with the patient Avoid sharing household items. You should not share dishes, drinking glasses, cups, eating utensils, towels, bedding, or other items with a patient who is confirmed to have, or being evaluated for, COVID-19 infection. After the person uses these items, you should wash them thoroughly with soap and water.  Wash laundry thoroughly Immediately remove and wash clothes or bedding that have blood, body fluids, and/or secretions or excretions, such as sweat, saliva, sputum, nasal mucus, vomit, urine, or feces, on them. Wear gloves when handling laundry from the patient. Read and follow directions on labels of laundry or  clothing items and detergent. In general, wash and dry with the warmest temperatures recommended on the label.  Clean all areas the individual has used often Clean all touchable surfaces, such as counters, tabletops, doorknobs, bathroom fixtures, toilets, phones, keyboards, tablets, and bedside tables, every day. Also, clean any surfaces that may have blood, body fluids, and/or secretions or excretions on them. Wear gloves when cleaning surfaces the patient has come in contact with. Use a diluted bleach solution (e.g.,  dilute bleach with 1 part bleach and 10 parts water) or a household disinfectant with a label that says EPA-registered for coronaviruses. To make a bleach solution at home, add 1 tablespoon of bleach to 1 quart (4 cups) of water. For a larger supply, add  cup of bleach to 1 gallon (16 cups) of water. Read labels of cleaning products and follow recommendations provided on product labels. Labels contain instructions for safe and effective use of the cleaning product including precautions you should take when applying the product, such as wearing gloves or eye protection and making sure you have good ventilation during use of the product. Remove gloves and wash hands immediately after cleaning.  Monitor yourself for signs and symptoms of illness Caregivers and household members are considered close contacts, should monitor their health, and will be asked to limit movement outside of the home to the extent possible. Follow the monitoring steps for close contacts listed on the symptom monitoring form.   ? If you have additional questions, contact your local health department or call the epidemiologist on call at [phone] (available 24/7). ? This guidance is subject to change. For the most up-to-date guidance from [facility], please refer to their website: [email or web address]

## 2021-06-20 NOTE — Progress Notes (Signed)
Virtual Visit via Phone Note  I connected with Willie Atkins on 06/20/21 at  4:00 PM EDT by phone and verified that I am speaking with the correct person using two identifiers.  Location: Patient: home Provider: [facility]   I discussed the limitations of evaluation and management by telemedicine and the availability of in person appointments. The patient expressed understanding and agreed to proceed.  History of Present Illness:  UPPER RESPIRATORY TRACT INFECTION - COVID+ 7/4, at home test - symptom onset 7/2 - has received 4 doses of mRNA vaccine  Worst symptom: Fever: yes, none currently Cough: yes Shortness of breath: no Wheezing: no Chest pain: no Chest tightness: no Chest congestion: yes Nasal congestion: yes Runny nose: yes Post nasal drip: yes Sinus pressure: yes Headache: no Face pain: no Ear pain: no  Ear pressure: no  Eyes red/itching:no Eye drainage/crusting: no  Vomiting: no Sick contacts: yes Relief with OTC cold/cough medications: yes  Treatments attempted: mucinex, sudafed, flonase    Observations/Objective:  Patient had trouble connecting to video visit, entirety of visit conducted over the phone.  Speaks in full sentences, no respiratory distress.  Assessment and Plan:  COVID-19 Doing well with mild sx. Is a candidate for paxlovid tx, rx sent to pharmacy and reviewed medication with patient. Patient information handout made available. Reviewed OTC symptom relief, self-quarantine guidelines, and emergency precautions.     I discussed the assessment and treatment plan with the patient. The patient was provided an opportunity to ask questions and all were answered. The patient agreed with the plan and demonstrated an understanding of the instructions.   The patient was advised to call back or seek an in-person evaluation if the symptoms worsen or if the condition fails to improve as anticipated.  I provided 10 minutes of non-face-to-face time during  this encounter.   Caro Laroche, DO

## 2021-06-20 NOTE — Telephone Encounter (Signed)
Pt tested positive covid yesterday, home test. Reports symptoms onset Saturday. [place]. Reports cough, congestion,mild body aches , sore throat.Marland Kitchen LGT 98.9-99.0. O2 sat 99%.  HAs been using OTC meds; Mucinex, Flonase, Tylenol.  Pt is interested in oral anti-viral meds.  Home care advise given per protocol. Guidelines for self isolation reviewed as well as symptoms that warrant ED eval. Assured pt NT would route to practice for PCPs review and final disposition.  Pt verbalizes understanding.  CB# [id number] Reason for Disposition  [1] COVID-19 diagnosed by positive lab test (e.g., PCR, rapid self-test kit) AND [2] mild symptoms (e.g., cough, fever, others) AND [7] no complications or SOB  Answer Assessment - Initial Assessment Questions 1. COVID-19 DIAGNOSIS: "Who made your COVID-19 diagnosis?" "Was it confirmed by a positive lab test or self-test?" If not diagnosed by a doctor (or NP/PA), ask "Are there lots of cases (community spread) where you live?" Note: See public health department website, if unsure.     Home test 2. COVID-19 EXPOSURE: "Was there any known exposure to COVID before the symptoms began?" CDC Definition of close contact: within 6 feet (2 meters) for a total of 15 minutes or more over a 24-hour period.     Traveled overseas 3. ONSET: "When did the COVID-19 symptoms start?"      Saturday 4. WORST SYMPTOM: "What is your worst symptom?" (e.g., cough, fever, shortness of breath, muscle aches)     Cough 5. COUGH: "Do you have a cough?" If Yes, ask: "How bad is the cough?"       yes 6. FEVER: "Do you have a fever?" If Yes, ask: "What is your temperature, how was it measured, and when did it start?"     -99.0 7. RESPIRATORY STATUS: "Describe your breathing?" (e.g., shortness of breath, wheezing, unable to speak)     8. BETTER-SAME-WORSE: "Are you getting better, staying the same or getting worse compared to yesterday?"  If getting worse, ask,  "In what way?"     9. HIGH RISK DISEASE: "Do you have any chronic medical problems?" (e.g., asthma, heart or lung disease, weak immune system, obesity, etc.)     10. VACCINE: "Have you had the COVID-19 vaccine?" If Yes, ask: "Which one, how many shots, when did you get it?"       Yes 11. BOOSTER: "Have you received your COVID-19 booster?" If Yes, ask: "Which one and when did you get it?"       Pfizer April '22 riod?"        13. OTHER SYMPTOMS: "Do you have any other symptoms?"  (e.g., chills, fatigue, headache, loss of smell or taste, muscle pain, sore throat)       Sore throat 14. O2 SATURATION MONITOR:  "Do you use an oxygen saturation monitor (pulse oximeter) at home?" If Yes, ask "What is your reading (oxygen level) today?" "What is your usual oxygen saturation reading?" (e.g., 95%)       99%  Protocols used: Coronavirus (COVID-19) Diagnosed or Suspected-A-AH

## 2021-06-29 ENCOUNTER — Other Ambulatory Visit: Payer: Self-pay

## 2021-06-29 ENCOUNTER — Ambulatory Visit (INDEPENDENT_AMBULATORY_CARE_PROVIDER_SITE_OTHER): Payer: 59 | Admitting: Emergency Medicine

## 2021-06-29 DIAGNOSIS — Z23 Encounter for immunization: Secondary | ICD-10-CM

## 2021-08-13 ENCOUNTER — Telehealth (INDEPENDENT_AMBULATORY_CARE_PROVIDER_SITE_OTHER): Payer: 59 | Admitting: Family Medicine

## 2021-08-13 ENCOUNTER — Ambulatory Visit: Payer: 59 | Admitting: Family Medicine

## 2021-08-13 ENCOUNTER — Encounter: Payer: Self-pay | Admitting: Family Medicine

## 2021-08-13 VITALS — Ht 73.0 in | Wt 250.0 lb

## 2021-08-13 DIAGNOSIS — M1990 Unspecified osteoarthritis, unspecified site: Secondary | ICD-10-CM | POA: Diagnosis not present

## 2021-08-13 DIAGNOSIS — E291 Testicular hypofunction: Secondary | ICD-10-CM

## 2021-08-13 MED ORDER — MELOXICAM 15 MG PO TABS: 15.0000 mg | ORAL_TABLET | Freq: Every day | ORAL | 1 refills | Status: DC | PRN

## 2021-08-13 NOTE — Assessment & Plan Note (Signed)
Refill provided of prn meloxicam. Encouraged voltaren gel.

## 2021-08-13 NOTE — Patient Instructions (Signed)
It was great to see you!  Our plans for today:  - Try diclofenac (Voltaren) gel for your arthritis pain.  - I sent a refill of your meloxicam to the pharmacy. - Come by the lab in the morning (between 8-10am) to repeat your testosterone labs. We will release these results to your MyChart.  Take care and seek immediate care sooner if you develop any concerns.   Dr. Linwood Dibbles

## 2021-08-13 NOTE — Progress Notes (Signed)
Virtual Visit via Telephone Note  I connected with Willie Atkins on 08/13/21 at 11:40 AM EDT by telephone and verified that I am speaking with the correct person using two identifiers.  Location: Patient: home Provider: [facility] ([place])   I discussed the limitations, risks, security and privacy concerns of performing an evaluation and management service by telephone and the availability of in person appointments. I also discussed with the patient that there may be a patient responsible charge related to this service. The patient expressed understanding and agreed to proceed.   History of Present Illness:  Arthritis, Foot - previously on meloxicam with good effect. Was taking daily, has since run out. Previously tried ibuprofen, tylenol without relief. - previously seen by podiatry. - XR c/w arthritis. 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  - no h/o gout - no h/o steroid injections in foot  ED  - h/o OSA on CPAP, obesity, hypogonadism.  - Labs 01/2021 with normal kidney, liver function, A1c. Low testosterone. - never smoker. - previously on testosterone supplementation. Had a few bottles of Androgel left, using one application daily but hasn't noticed symptom change.  - has trouble getting and maintaining erection - no h/o urogenital surgeries other than vasectomy ~30 years ago. - previously saw urology for kidney stones.  - never tried viagra, cialis.    Observations/Objective:  Entirety of visit conducted over the phone.  Speaks in full sentences, no respiratory distress.   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 Will recheck testosterone levels given lack of symptom relief with supplementation. Consider PDE inhibitor if levels wnl.   Arthritis Refill provided of prn meloxicam. Encouraged voltaren gel.     I discussed the assessment and treatment plan with the patient. The patient was provided an opportunity to ask questions and all were answered. The  patient agreed with the plan and demonstrated an understanding of the instructions.   The patient was advised to call back or seek an in-person evaluation if the symptoms worsen or if the condition fails to improve as anticipated.  I provided 14 minutes of non-face-to-face time during this encounter.   Caro Laroche, DO

## 2021-08-13 NOTE — Assessment & Plan Note (Signed)
With erectile dysfunction. Will recheck testosterone levels given lack of symptom relief with supplementation. Consider PDE inhibitor if levels wnl.

## 2021-08-17 LAB — TESTOSTERONE, FREE & TOTAL
Free Testosterone: 63.3 pg/mL (ref 35.0–155.0)
Testosterone, Total, LC-MS-MS: 348 ng/dL (ref 250–1100)

## 2021-08-21 ENCOUNTER — Encounter: Payer: Self-pay | Admitting: Family Medicine

## 2021-08-21 ENCOUNTER — Other Ambulatory Visit: Payer: Self-pay | Admitting: Family Medicine

## 2021-08-21 MED ORDER — TADALAFIL 10 MG PO TABS: 10.0000 mg | ORAL_TABLET | ORAL | 0 refills | Status: DC | PRN

## 2021-10-08 ENCOUNTER — Other Ambulatory Visit: Payer: Self-pay | Admitting: Family Medicine

## 2021-11-01 ENCOUNTER — Other Ambulatory Visit: Payer: Self-pay | Admitting: Family Medicine

## 2021-11-30 ENCOUNTER — Other Ambulatory Visit: Payer: Self-pay | Admitting: Family Medicine

## 2021-12-01 NOTE — Telephone Encounter (Signed)
° °  Notes to clinic:  pharm states pt requesting 90 day rx for insurance purposes, if so pharm will need new rx, please assess.   Requested Prescriptions  Pending Prescriptions Disp Refills   tadalafil (CIALIS) 10 MG tablet [Pharmacy Med Name: TADALAFIL 10 MG TABLET] 8 tablet 1    Sig: TAKE 1-2 TABLETS (10-20 MG TOTAL) BY MOUTH EVERY 3 (THREE) DAYS AS NEEDED FOR ERECTILE DYSFUNCTION.     Urology: Erectile Dysfunction Agents Passed - 11/30/2021  7:13 PM      Passed - Last BP in normal range    BP Readings from Last 1 Encounters:  04/30/21 136/80          Passed - Valid encounter within last 12 months    Recent Outpatient Visits           3 months ago Hypogonadism male   [facility] Caro Laroche, DO   5 months ago COVID-19   [facility] Caro Laroche, DO   7 months ago Well adult exam   [facility] Alba Cory, MD   10 months ago Dyslipidemia   [facility] Alba Cory, MD   2 years ago OSA on CPAP   [facility] Alba Cory, MD       Future Appointments             In 5 months Alba Cory, MD [facility], [facility]

## 2021-12-03 ENCOUNTER — Other Ambulatory Visit: Payer: Self-pay

## 2021-12-04 MED ORDER — TADALAFIL 10 MG PO TABS: 10.0000 mg | ORAL_TABLET | ORAL | 0 refills | Status: DC | PRN

## 2021-12-04 NOTE — Telephone Encounter (Signed)
Left message for patent to return call

## 2021-12-04 NOTE — Telephone Encounter (Signed)
Pt states that the tadalafil will be covered by his insurance but it has to be written out as a 90 day supply and not 30 day. Hes asking for 36 pills for 90 days they will cover 12 pills a month

## 2021-12-19 ENCOUNTER — Telehealth (INDEPENDENT_AMBULATORY_CARE_PROVIDER_SITE_OTHER): Payer: 59 | Admitting: Nurse Practitioner

## 2021-12-19 ENCOUNTER — Encounter: Payer: Self-pay | Admitting: Nurse Practitioner

## 2021-12-19 ENCOUNTER — Other Ambulatory Visit: Payer: Self-pay

## 2021-12-19 DIAGNOSIS — Z9989 Dependence on other enabling machines and devices: Secondary | ICD-10-CM | POA: Diagnosis not present

## 2021-12-19 DIAGNOSIS — G4733 Obstructive sleep apnea (adult) (pediatric): Secondary | ICD-10-CM | POA: Diagnosis not present

## 2021-12-19 NOTE — Progress Notes (Signed)
Name: Willie Atkins   MRN: [id number]    DOB: June 04, 1961   Date:12/19/2021       Progress Note  Subjective  Chief Complaint  Chief Complaint  Patient presents with   Sleep Apnea    Need new CPap machine and equipment     I connected with  Willie Atkins  on 12/19/21 at  1:00 PM EST by a video enabled telemedicine application and verified that I am speaking with the correct person using two identifiers.  I discussed the limitations of evaluation and management by telemedicine and the availability of in person appointments. The patient expressed understanding and agreed to proceed with a virtual visit  Staff also discussed with the patient that there may be a patient responsible charge related to this service. 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 He says he is about to retire and needs to get a new machine.  He says he has had his current machine for six years and it is very warn down and needs a new one. He says he uses it every night.  Sleep study was done six years ago at [facility]. Order placed.   Patient Active Problem List   Diagnosis Date Noted   Arthritis 08/13/2021   Encounter for screening colonoscopy    Polyp of colon    Breast tenderness in male 08/31/2015   Gynecomastia, male 08/31/2015   Allergic rhinitis 08/15/2015   Mitral valve disorder 08/15/2015   Chronic meniscal tear of knee 08/15/2015   Dyslipidemia 08/14/2015   Hypogonadism male [id number]   Metabolic syndrome [id number]   Depression with anxiety 08/14/2015   OSA on CPAP 08/14/2015   S/P arthroscopic surgery of left knee [id number]   Umbilical hernia without obstruction and without gangrene 08/14/2015   Left Achilles tendinitis 08/14/2015    Social History   Tobacco Use   Smoking status: Never   Smokeless tobacco: Never  Substance Use Topics   Alcohol use: Yes    Alcohol/week: 2.0  standard drinks    Types: 2 Glasses of wine per week    Comment: Wine with dinner nightly     Current Outpatient Medications:    cyanocobalamin 1000 MCG tablet, Take 1,000 mcg by mouth daily., Disp: , Rfl:    L-Arginine 1000 MG TABS, Take by mouth., Disp: , Rfl:    meloxicam (MOBIC) 15 MG tablet, Take 1 tablet (15 mg total) by mouth daily as needed for pain., Disp: 45 tablet, Rfl: 1   tadalafil (CIALIS) 10 MG tablet, Take 1-2 tablets (10-20 mg total) by mouth every 3 (three) days as needed for erectile dysfunction., Disp: 36 tablet, Rfl: 0  No Known Allergies  I personally reviewed active problem list, medication list, allergies, notes from last encounter with the patient/caregiver today.  ROS  Constitutional: Negative for fever or weight change.  Respiratory: Negative for cough and shortness of breath.   Cardiovascular: Negative for chest pain or palpitations.  Gastrointestinal: Negative for abdominal pain, no bowel changes.  Musculoskeletal: Negative for gait problem or joint swelling.  Skin: Negative for rash.  Neurological: Negative for dizziness or headache.  No other specific complaints in a complete review of systems (except as listed in HPI above).   Objective  Virtual encounter, vitals not obtained.  There is no height or weight on file to calculate BMI.  Nursing Note and Vital Signs reviewed.  Physical  Exam  Awake,alert and oriented, speaking in complete sentences  No results found for this or any previous visit (from the past 72 hour(s)).  Assessment & Plan  1. OSA on CPAP  - Ambulatory Referral for DME   -Red flags and when to present for emergency care or RTC including fever >101.35F, chest pain, shortness of breath, new/worsening/un-resolving symptoms, reviewed with patient at time of visit. Follow up and care instructions discussed and provided in AVS. - I discussed the assessment and treatment plan with the patient. The patient was provided an  opportunity to ask questions and all were answered. The patient agreed with the plan and demonstrated an understanding of the instructions.  I provided 15 minutes of non-face-to-face time during this encounter.  Bo Merino, FNP

## 2021-12-20 ENCOUNTER — Telehealth: Payer: Self-pay

## 2021-12-20 NOTE — Telephone Encounter (Signed)
Notes have been sent to [employer] via fax [phone] on today, 12/20/21 for ordering of new CPAP equipment.

## 2021-12-27 ENCOUNTER — Encounter: Payer: Self-pay | Admitting: Nurse Practitioner

## 2022-01-03 ENCOUNTER — Encounter: Payer: Self-pay | Admitting: Family Medicine

## 2022-03-27 ENCOUNTER — Other Ambulatory Visit: Payer: Self-pay | Admitting: Family Medicine

## 2022-03-27 MED ORDER — TADALAFIL 10 MG PO TABS: 10.0000 mg | ORAL_TABLET | ORAL | 0 refills | Status: DC | PRN

## 2022-04-15 DIAGNOSIS — M79631 Pain in right forearm: Secondary | ICD-10-CM | POA: Insufficient documentation

## 2022-05-03 ENCOUNTER — Ambulatory Visit (INDEPENDENT_AMBULATORY_CARE_PROVIDER_SITE_OTHER): Payer: 59 | Admitting: Family Medicine

## 2022-05-03 ENCOUNTER — Encounter: Payer: Self-pay | Admitting: Family Medicine

## 2022-05-03 VITALS — BP 132/84 | HR 91 | Temp 97.8°F | Resp 16 | Ht 73.0 in | Wt 258.5 lb

## 2022-05-03 DIAGNOSIS — E538 Deficiency of other specified B group vitamins: Secondary | ICD-10-CM | POA: Diagnosis not present

## 2022-05-03 DIAGNOSIS — Z125 Encounter for screening for malignant neoplasm of prostate: Secondary | ICD-10-CM

## 2022-05-03 DIAGNOSIS — N528 Other male erectile dysfunction: Secondary | ICD-10-CM

## 2022-05-03 DIAGNOSIS — Z9989 Dependence on other enabling machines and devices: Secondary | ICD-10-CM

## 2022-05-03 DIAGNOSIS — G4733 Obstructive sleep apnea (adult) (pediatric): Secondary | ICD-10-CM | POA: Diagnosis not present

## 2022-05-03 DIAGNOSIS — Z0000 Encounter for general adult medical examination without abnormal findings: Secondary | ICD-10-CM | POA: Diagnosis not present

## 2022-05-03 DIAGNOSIS — B9689 Other specified bacterial agents as the cause of diseases classified elsewhere: Secondary | ICD-10-CM

## 2022-05-03 DIAGNOSIS — E669 Obesity, unspecified: Secondary | ICD-10-CM

## 2022-05-03 DIAGNOSIS — E785 Hyperlipidemia, unspecified: Secondary | ICD-10-CM

## 2022-05-03 DIAGNOSIS — J069 Acute upper respiratory infection, unspecified: Secondary | ICD-10-CM

## 2022-05-03 DIAGNOSIS — E291 Testicular hypofunction: Secondary | ICD-10-CM | POA: Diagnosis not present

## 2022-05-03 DIAGNOSIS — E8881 Metabolic syndrome: Secondary | ICD-10-CM

## 2022-05-03 DIAGNOSIS — Z79899 Other long term (current) drug therapy: Secondary | ICD-10-CM

## 2022-05-03 MED ORDER — AMOXICILLIN-POT CLAVULANATE 875-125 MG PO TABS: 1.0000 | ORAL_TABLET | Freq: Two times a day (BID) | ORAL | 0 refills | Status: DC

## 2022-05-03 MED ORDER — BENZONATATE 200 MG PO CAPS: 200.0000 mg | ORAL_CAPSULE | Freq: Three times a day (TID) | ORAL | 0 refills | Status: DC | PRN

## 2022-05-03 MED ORDER — TADALAFIL 10 MG PO TABS: 10.0000 mg | ORAL_TABLET | ORAL | 3 refills | Status: DC | PRN
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
## 2022-05-04 LAB — B12 AND FOLATE PANEL
Folate: 9.5 ng/mL
Vitamin B-12: 507 pg/mL (ref 200–1100)

## 2022-05-04 LAB — LIPID PANEL
Cholesterol: 146 mg/dL (ref <200)
HDL: 42 mg/dL (ref >=40)
LDL Cholesterol (Calc): 84 mg/dL (calc)
Non-HDL Cholesterol (Calc): 104 mg/dL (calc) (ref <130)
Total CHOL/HDL Ratio: 3.5 (calc) (ref <5.0)
Triglycerides: 103 mg/dL (ref <150)

## 2022-05-04 LAB — HEMOGLOBIN A1C
Hgb A1c MFr Bld: 5.3 % of total Hgb (ref <5.7)
Mean Plasma Glucose: 105 mg/dL
eAG (mmol/L): 5.8 mmol/L

## 2022-05-04 LAB — COMPLETE METABOLIC PANEL WITH GFR
AG Ratio: 1.4 (calc) (ref 1.0–2.5)
ALT: 16 U/L (ref 9–46)
AST: 14 U/L (ref 10–35)
Albumin: 4.2 g/dL (ref 3.6–5.1)
Alkaline phosphatase (APISO): 61 U/L (ref 35–144)
BUN: 23 mg/dL (ref 7–25)
CO2: 25 mmol/L (ref 20–32)
Calcium: 8.9 mg/dL (ref 8.6–10.3)
Chloride: 108 mmol/L (ref 98–110)
Creat: 0.97 mg/dL (ref 0.70–1.35)
Globulin: 2.9 g/dL (calc) (ref 1.9–3.7)
Glucose, Bld: 91 mg/dL (ref 65–99)
Potassium: 4.6 mmol/L (ref 3.5–5.3)
Sodium: 142 mmol/L (ref 135–146)
Total Bilirubin: 0.6 mg/dL (ref 0.2–1.2)
Total Protein: 7.1 g/dL (ref 6.1–8.1)
eGFR: 88 mL/min/{1.73_m2} (ref >=60)

## 2022-05-04 LAB — CBC WITH DIFFERENTIAL/PLATELET
Absolute Monocytes: 432 cells/uL (ref 200–950)
Basophils Absolute: 32 cells/uL (ref 0–200)
Basophils Relative: 0.4 %
Eosinophils Absolute: 192 cells/uL (ref 15–500)
Eosinophils Relative: 2.4 %
HCT: 46.1 % (ref 38.5–50.0)
Hemoglobin: 15.7 g/dL (ref 13.2–17.1)
Lymphs Abs: 2152 cells/uL (ref 850–3900)
MCH: 28.9 pg (ref 27.0–33.0)
MCHC: 34.1 g/dL (ref 32.0–36.0)
MCV: 84.9 fL (ref 80.0–100.0)
MPV: 9.9 fL (ref 7.5–12.5)
Monocytes Relative: 5.4 %
Neutro Abs: 5192 cells/uL (ref 1500–7800)
Neutrophils Relative %: 64.9 %
Platelets: 203 10*3/uL (ref 140–400)
RBC: 5.43 10*6/uL (ref 4.20–5.80)
RDW: 13.2 % (ref 11.0–15.0)
Total Lymphocyte: 26.9 %
WBC: 8 10*3/uL (ref 3.8–10.8)

## 2022-05-04 LAB — PSA: PSA: 0.56 ng/mL (ref <=4.00)

## 2022-05-07 ENCOUNTER — Other Ambulatory Visit: Payer: Self-pay | Admitting: Family Medicine

## 2022-05-07 ENCOUNTER — Encounter: Payer: Self-pay | Admitting: Family Medicine

## 2022-05-07 MED ORDER — ROSUVASTATIN CALCIUM 20 MG PO TABS: 20.0000 mg | ORAL_TABLET | Freq: Every day | ORAL | 1 refills | Status: DC

## 2022-06-19 ENCOUNTER — Other Ambulatory Visit: Payer: Self-pay | Admitting: Family Medicine

## 2022-06-19 DIAGNOSIS — N528 Other male erectile dysfunction: Secondary | ICD-10-CM

## 2022-06-19 NOTE — Telephone Encounter (Signed)
Medication Refill - Medication: tadalafil (CIALIS) 10 MG tablet   Has the patient contacted their pharmacy? yes (Agent: If no, request that the patient contact the pharmacy for the refill. If patient does not wish to contact the pharmacy document the reason why and proceed with request.) (Agent: If yes, when and what did the pharmacy advise?)pharmacy called in directly, patient didn't want to use local pharmacy  Preferred Pharmacy (with phone number or street name):  [facility] - [place], [place] - [address] Phone:  [phone]  Fax:  [phone]     Has the patient been seen for an appointment in the last year OR does the patient have an upcoming appointment? yes Agent: Please be advised that RX refills may take up to 3 business days. We ask that you follow-up with your pharmacy.

## 2022-06-20 MED ORDER — TADALAFIL 10 MG PO TABS: 10.0000 mg | ORAL_TABLET | ORAL | 3 refills | Status: DC | PRN

## 2022-06-20 NOTE — Telephone Encounter (Signed)
Requested Prescriptions  Pending Prescriptions Disp Refills  . tadalafil (CIALIS) 10 MG tablet 24 tablet 3    Sig: Take 1-2 tablets (10-20 mg total) by mouth every 3 (three) days as needed for erectile dysfunction.     Urology: Erectile Dysfunction Agents Passed - 06/19/2022  5:11 PM      Passed - AST in normal range and within 360 days    AST  Date Value Ref Range Status  05/03/2022 14 10 - 35 U/L Final         Passed - ALT in normal range and within 360 days    ALT  Date Value Ref Range Status  05/03/2022 16 9 - 46 U/L Final         Passed - Last BP in normal range    BP Readings from Last 1 Encounters:  05/03/22 132/84         Passed - Valid encounter within last 12 months    Recent Outpatient Visits          1 month ago Metabolic syndrome   [facility] Alba Cory, MD   6 months ago OSA on CPAP   [facility] Berniece Salines, FNP   10 months ago Hypogonadism male   [facility] Caro Laroche, DO   1 year ago COVID-19   [facility] Caro Laroche, DO   1 year ago Well adult exam   [facility] Alba Cory, MD      Future Appointments            In 1 month Alba Cory, MD [facility]   In 10 months Alba Cory, MD [facility]

## 2022-07-30 NOTE — Progress Notes (Unsigned)
Name: Willie Atkins   MRN: [id number]    DOB: November 21, 1961   Date:07/31/2022       Progress Note  Subjective  Chief Complaint  Follow Up  HPI  ED: using Cialis and able to start and maintain erections, the insurance is not covering, we will send it to [facility] so she can use GoodRx  OA right foot: seen by Podiatrist and takes Meloxicam prn . He is using ice and elevation prn   Hypogonadism: no longer taking testosterone since able to have an erection with Cialis and has normal libido , no problems   Dyslipidemia: taking statin therapy and denies side effects of medications, we will recheck level   Pre-diabetes : last A1C was at goal, he lost weight and is now maintaining , denies polyphagia, polydipsia or polyuria   OSA: he is compliant with machine - wears it every night all night long . He wakes up feeling rested  Patient Active Problem List   Diagnosis Date Noted   Pain of right forearm 04/15/2022   Arthritis 08/13/2021   Polyp of colon    Breast tenderness in male 08/31/2015   Gynecomastia, male 08/31/2015   Allergic rhinitis 08/15/2015   Mitral valve disorder 08/15/2015   Chronic meniscal tear of knee 08/15/2015   Dyslipidemia 08/14/2015   Hypogonadism male 75/91/6384   Metabolic syndrome 66/59/9357   Depression with anxiety 08/14/2015   OSA on CPAP 08/14/2015   S/P arthroscopic surgery of left knee 01/77/9390   Umbilical hernia without obstruction and without gangrene 08/14/2015   Left Achilles tendinitis 08/14/2015    Past Surgical History:  Procedure Laterality Date   COLONOSCOPY WITH PROPOFOL N/A 03/01/2021   Procedure: COLONOSCOPY WITH PROPOFOL;  Surgeon: Virgel Manifold, MD;  Location: [facility] ENDOSCOPY;  Service: Endoscopy;  Laterality: N/A;   KNEE SURGERY Left 08/2014   VASECTOMY      Family History  Problem Relation Age of Onset   Cancer Mother    Heart disease Father    Stroke Father     Social History   Tobacco Use   Smoking status: Never    Smokeless tobacco: Never  Substance Use Topics   Alcohol use: Yes    Alcohol/week: 2.0 standard drinks of alcohol    Types: 2 Glasses of wine per week    Comment: Wine with dinner nightly     Current Outpatient Medications:    cyanocobalamin 1000 MCG tablet, Take 1,000 mcg by mouth daily., Disp: , Rfl:    meloxicam (MOBIC) 15 MG tablet, Take 1 tablet (15 mg total) by mouth daily as needed for pain., Disp: 45 tablet, Rfl: 1   rosuvastatin (CRESTOR) 20 MG tablet, Take 1 tablet (20 mg total) by mouth daily., Disp: 90 tablet, Rfl: 1   tadalafil (CIALIS) 10 MG tablet, Take 1-2 tablets (10-20 mg total) by mouth every 3 (three) days as needed for erectile dysfunction., Disp: 24 tablet, Rfl: 3  No Known Allergies  I personally reviewed active problem list, medication list, allergies, family history, social history, health maintenance with the patient/caregiver today.   ROS  Ten systems reviewed and is negative except as mentioned in HPI   Objective  Vitals:   07/31/22 1009  BP: 124/66  Pulse: 91  Resp: 16  SpO2: 98%  Weight: 257 lb (116.6 kg)  Height: '6\' 1"'  (1.854 m)    Body mass index is 33.91 kg/m.  Physical Exam  Constitutional: Patient appears well-developed and well-nourished. Obese  No distress.  HEENT:  head atraumatic, normocephalic, pupils equal and reactive to light, neck supple Cardiovascular: Normal rate, regular rhythm and normal heart sounds.  No murmur heard. No BLE edema. Pulmonary/Chest: Effort normal and breath sounds normal. No respiratory distress. Abdominal: Soft.  There is no tenderness. Psychiatric: Patient has a normal mood and affect. behavior is normal. Judgment and thought content normal.   Recent Results (from the past 2160 hour(s))  Lipid panel     Status: None   Collection Time: 05/03/22  8:34 AM  Result Value Ref Range   Cholesterol 146 <200 mg/dL   HDL 42 > OR = 40 mg/dL   Triglycerides 103 <150 mg/dL   LDL Cholesterol (Calc) 84 mg/dL  (calc)    Comment: Reference range: <100 . Desirable range <100 mg/dL for primary prevention;   <70 mg/dL for patients with CHD or diabetic patients  with > or = 2 CHD risk factors. Marland Kitchen LDL-C is now calculated using the Martin-Hopkins  calculation, which is a validated novel method providing  better accuracy than the Friedewald equation in the  estimation of LDL-C.  Cresenciano Genre et al. Annamaria Helling. 8242;353(61): 2061-2068  (http://education.[facility].com/faq/FAQ164)    Total CHOL/HDL Ratio 3.5 <5.0 (calc)   Non-HDL Cholesterol (Calc) 104 <130 mg/dL (calc)    Comment: For patients with diabetes plus 1 major ASCVD risk  factor, treating to a non-HDL-C goal of <100 mg/dL  (LDL-C of <70 mg/dL) is considered a therapeutic  option.   CBC with Differential/Platelet     Status: None   Collection Time: 05/03/22  8:34 AM  Result Value Ref Range   WBC 8.0 3.8 - 10.8 Thousand/uL   RBC 5.43 4.20 - 5.80 Million/uL   Hemoglobin 15.7 13.2 - 17.1 g/dL   HCT 46.1 38.5 - 50.0 %   MCV 84.9 80.0 - 100.0 fL   MCH 28.9 27.0 - 33.0 pg   MCHC 34.1 32.0 - 36.0 g/dL   RDW 13.2 11.0 - 15.0 %   Platelets 203 140 - 400 Thousand/uL   MPV 9.9 7.5 - 12.5 fL   Neutro Abs 5,192 1,500 - 7,800 cells/uL   Lymphs Abs 2,152 850 - 3,900 cells/uL   Absolute Monocytes 432 200 - 950 cells/uL   Eosinophils Absolute 192 15 - 500 cells/uL   Basophils Absolute 32 0 - 200 cells/uL   Neutrophils Relative % 64.9 %   Total Lymphocyte 26.9 %   Monocytes Relative 5.4 %   Eosinophils Relative 2.4 %   Basophils Relative 0.4 %  COMPLETE METABOLIC PANEL WITH GFR     Status: None   Collection Time: 05/03/22  8:34 AM  Result Value Ref Range   Glucose, Bld 91 65 - 99 mg/dL    Comment: .            Fasting reference interval .    BUN 23 7 - 25 mg/dL   Creat 0.97 0.70 - 1.35 mg/dL   eGFR 88 > OR = 60 mL/min/1.4m    Comment: The eGFR is based on the CKD-EPI 2021 equation. To calculate  the new eGFR from a previous Creatinine  or Cystatin C result, go to https://www.kidney.org/professionals/ kdoqi/gfr%5Fcalculator    BUN/Creatinine Ratio NOT APPLICABLE 6 - 22 (calc)   Sodium 142 135 - 146 mmol/L   Potassium 4.6 3.5 - 5.3 mmol/L   Chloride 108 98 - 110 mmol/L   CO2 25 20 - 32 mmol/L   Calcium 8.9 8.6 - 10.3 mg/dL   Total Protein 7.1 6.1 - 8.1 g/dL  Albumin 4.2 3.6 - 5.1 g/dL   Globulin 2.9 1.9 - 3.7 g/dL (calc)   AG Ratio 1.4 1.0 - 2.5 (calc)   Total Bilirubin 0.6 0.2 - 1.2 mg/dL   Alkaline phosphatase (APISO) 61 35 - 144 U/L   AST 14 10 - 35 U/L   ALT 16 9 - 46 U/L  B12 and Folate Panel     Status: None   Collection Time: 05/03/22  8:34 AM  Result Value Ref Range   Vitamin B-12 507 200 - 1,100 pg/mL   Folate 9.5 ng/mL    Comment:                            Reference Range                            Low:           <3.4                            Borderline:    3.4-5.4                            Normal:        >5.4 .   Hemoglobin A1c     Status: None   Collection Time: 05/03/22  8:34 AM  Result Value Ref Range   Hgb A1c MFr Bld 5.3 <5.7 % of total Hgb    Comment: For the purpose of screening for the presence of diabetes: . <5.7%       Consistent with the absence of diabetes 5.7-6.4%    Consistent with increased risk for diabetes             (prediabetes) > or =6.5%  Consistent with diabetes . This assay result is consistent with a decreased risk of diabetes. . Currently, no consensus exists regarding use of hemoglobin A1c for diagnosis of diabetes in children. . According to American Diabetes Association (ADA) guidelines, hemoglobin A1c <7.0% represents optimal control in non-pregnant diabetic patients. Different metrics may apply to specific patient populations.  Standards of Medical Care in Diabetes(ADA). .    Mean Plasma Glucose 105 mg/dL   eAG (mmol/L) 5.8 mmol/L  PSA     Status: None   Collection Time: 05/03/22  8:34 AM  Result Value Ref Range   PSA 0.56 < OR = 4.00 ng/mL     Comment: The total PSA value from this assay system is  standardized against the WHO standard. The test  result will be approximately 20% lower when compared  to the equimolar-standardized total PSA (Beckman  Coulter). Comparison of serial PSA results should be  interpreted with this fact in mind. . This test was performed using the Siemens  chemiluminescent method. Values obtained from  different assay methods cannot be used interchangeably. PSA levels, regardless of value, should not be interpreted as absolute evidence of the presence or absence of disease.     PHQ2/9:    07/31/2022   10:09 AM 05/03/2022    7:58 AM 12/19/2021   12:38 PM 08/13/2021    9:42 AM 06/20/2021    9:52 AM  Depression screen PHQ 2/9  Decreased Interest 0 0 0 0 0  Down, Depressed, Hopeless 0 1 0 0 0  PHQ - 2 Score 0 1 0 0 0  Altered sleeping 0 1  0 0  Tired, decreased energy 0 0  0 0  Change in appetite 0 0  0 0  Feeling bad or failure about yourself  0 0  0 0  Trouble concentrating 0 0  0 0  Moving slowly or fidgety/restless 0 0  0 0  Suicidal thoughts 0 0  0 0  PHQ-9 Score 0 2  0 0  Difficult doing work/chores  Not difficult at all  Not difficult at all Not difficult at all    phq 9 is negative   Fall Risk:    07/31/2022   10:09 AM 05/03/2022    7:58 AM 12/19/2021   12:38 PM 08/13/2021    9:41 AM 06/20/2021    9:52 AM  Fall Risk   Falls in the past year? 0 0 0 0 0  Number falls in past yr: 0  0 0 0  Injury with Fall? 0  0 0 0  Risk for fall due to : No Fall Risks No Fall Risks     Follow up Falls prevention discussed Falls prevention discussed;Falls evaluation completed;Education provided Falls evaluation completed        Functional Status Survey: Is the patient deaf or have difficulty hearing?: No Does the patient have difficulty seeing, even when wearing glasses/contacts?: No Does the patient have difficulty concentrating, remembering, or making decisions?: No Does the patient have  difficulty walking or climbing stairs?: No Does the patient have difficulty dressing or bathing?: No Does the patient have difficulty doing errands alone such as visiting a doctor's office or shopping?: No    Assessment & Plan   1. OSA on CPAP  Continue use of CPAP  2. Obesity (BMI 30-39.9)   Discussed with the patient the risk posed by an increased BMI. Discussed importance of portion control, calorie counting and at least 150 minutes of physical activity weekly. Avoid sweet beverages and drink more water. Eat at least 6 servings of fruit and vegetables daily    3. B12 deficiency   4. Other male erectile dysfunction  - tadalafil (CIALIS) 10 MG tablet; Take 1-2 tablets (10-20 mg total) by mouth every 3 (three) days as needed for erectile dysfunction.  Dispense: 90 tablet; Refill: 0  5. Dyslipidemia  - Lipid panel - COMPLETE METABOLIC PANEL WITH GFR  6. Hypogonadism male   7. Metabolic syndrome  On life style modificaiton

## 2022-07-31 ENCOUNTER — Encounter: Payer: Self-pay | Admitting: Family Medicine

## 2022-07-31 ENCOUNTER — Ambulatory Visit (INDEPENDENT_AMBULATORY_CARE_PROVIDER_SITE_OTHER): Payer: 59 | Admitting: Family Medicine

## 2022-07-31 VITALS — BP 124/66 | HR 91 | Resp 16 | Ht 73.0 in | Wt 257.0 lb

## 2022-07-31 DIAGNOSIS — G4733 Obstructive sleep apnea (adult) (pediatric): Secondary | ICD-10-CM | POA: Diagnosis not present

## 2022-07-31 DIAGNOSIS — E669 Obesity, unspecified: Secondary | ICD-10-CM | POA: Diagnosis not present

## 2022-07-31 DIAGNOSIS — N528 Other male erectile dysfunction: Secondary | ICD-10-CM

## 2022-07-31 DIAGNOSIS — Z9989 Dependence on other enabling machines and devices: Secondary | ICD-10-CM

## 2022-07-31 DIAGNOSIS — E8881 Metabolic syndrome: Secondary | ICD-10-CM

## 2022-07-31 DIAGNOSIS — E785 Hyperlipidemia, unspecified: Secondary | ICD-10-CM

## 2022-07-31 DIAGNOSIS — E291 Testicular hypofunction: Secondary | ICD-10-CM

## 2022-07-31 DIAGNOSIS — E538 Deficiency of other specified B group vitamins: Secondary | ICD-10-CM | POA: Diagnosis not present

## 2022-07-31 MED ORDER — TADALAFIL 10 MG PO TABS: 10.0000 mg | ORAL_TABLET | ORAL | 0 refills | Status: DC | PRN

## 2022-08-01 LAB — COMPLETE METABOLIC PANEL WITH GFR
AG Ratio: 1.5 (calc) (ref 1.0–2.5)
ALT: 22 U/L (ref 9–46)
AST: 17 U/L (ref 10–35)
Albumin: 4.4 g/dL (ref 3.6–5.1)
Alkaline phosphatase (APISO): 58 U/L (ref 35–144)
BUN: 24 mg/dL (ref 7–25)
CO2: 25 mmol/L (ref 20–32)
Calcium: 9.6 mg/dL (ref 8.6–10.3)
Chloride: 106 mmol/L (ref 98–110)
Creat: 1.05 mg/dL (ref 0.70–1.35)
Globulin: 3 g/dL (calc) (ref 1.9–3.7)
Glucose, Bld: 94 mg/dL (ref 65–99)
Potassium: 4.4 mmol/L (ref 3.5–5.3)
Sodium: 141 mmol/L (ref 135–146)
Total Bilirubin: 1 mg/dL (ref 0.2–1.2)
Total Protein: 7.4 g/dL (ref 6.1–8.1)
eGFR: 80 mL/min/{1.73_m2} (ref >=60)

## 2022-08-01 LAB — LIPID PANEL
Cholesterol: 126 mg/dL (ref <200)
HDL: 47 mg/dL (ref >=40)
LDL Cholesterol (Calc): 54 mg/dL (calc)
Non-HDL Cholesterol (Calc): 79 mg/dL (calc) (ref <130)
Total CHOL/HDL Ratio: 2.7 (calc) (ref <5.0)
Triglycerides: 172 mg/dL — ABNORMAL HIGH (ref <150)

## 2022-10-25 ENCOUNTER — Other Ambulatory Visit: Payer: Self-pay | Admitting: Family Medicine

## 2022-10-25 NOTE — Telephone Encounter (Signed)
Requested Prescriptions  Pending Prescriptions Disp Refills   rosuvastatin (CRESTOR) 20 MG tablet [Pharmacy Med Name: ROSUVASTATIN CALCIUM 20 MG TAB] 90 tablet 2    Sig: TAKE 1 TABLET BY MOUTH EVERY DAY     Cardiovascular:  Antilipid - Statins 2 Failed - 10/25/2022  5:26 PM      Failed - Lipid Panel in normal range within the last 12 months    Cholesterol, Total  Date Value Ref Range Status  02/04/2018 136 100 - 199 mg/dL Final   Cholesterol  Date Value Ref Range Status  07/31/2022 126 <200 mg/dL Final   LDL Cholesterol (Calc)  Date Value Ref Range Status  07/31/2022 54 mg/dL (calc) Final    Comment:    Reference range: <100 . Desirable range <100 mg/dL for primary prevention;   <70 mg/dL for patients with CHD or diabetic patients  with > or = 2 CHD risk factors. Marland Kitchen LDL-C is now calculated using the Martin-Hopkins  calculation, which is a validated novel method providing  better accuracy than the Friedewald equation in the  estimation of LDL-C.  Horald Pollen et al. Lenox Ahr. 1443;154(00): 2061-2068  (http://education.[facility].com/faq/FAQ164)    HDL  Date Value Ref Range Status  07/31/2022 47 > OR = 40 mg/dL Final  86/76/1950 53 >93 mg/dL Final   Triglycerides  Date Value Ref Range Status  07/31/2022 172 (H) <150 mg/dL Final         Passed - Cr in normal range and within 360 days    Creat  Date Value Ref Range Status  07/31/2022 1.05 0.70 - 1.35 mg/dL Final         Passed - Patient is not pregnant      Passed - Valid encounter within last 12 months    Recent Outpatient Visits           2 months ago OSA on CPAP   [facility] Alba Cory, MD   5 months ago Metabolic syndrome   [facility] Alba Cory, MD   10 months ago OSA on CPAP   [facility] Berniece Salines, FNP   1 year ago Hypogonadism male   [facility] Caro Laroche, DO   1 year ago COVID-19   [facility] Caro Laroche, DO       Future Appointments             In 6 months Carlynn Purl, Danna Hefty, MD [facility]

## 2023-05-07 NOTE — Progress Notes (Unsigned)
Name: Willie Atkins   MRN: [id number]    DOB: 1961-04-07   Date:05/08/2023       Progress Note  Subjective  Chief Complaint  Annual Exam  HPI  Patient presents for annual CPE.  IPSS Questionnaire (AUA-7): Over the past month.   1)  How often have you had a sensation of not emptying your bladder completely after you finish urinating?  0 - Not at all  2)  How often have you had to urinate again less than two hours after you finished urinating? 0 - Not at all  3)  How often have you found you stopped and started again several times when you urinated?  0 - Not at all  4) How difficult have you found it to postpone urination?  0 - Not at all  5) How often have you had a weak urinary stream?  0 - Not at all  6) How often have you had to push or strain to begin urination?  0 - Not at all  7) How many times did you most typically get up to urinate from the time you went to bed until the time you got up in the morning?  2 - 2 times  Total score:  0-7 mildly symptomatic   8-19 moderately symptomatic   20-35 severely symptomatic     Diet: eat mostly at home , eats fast food a couple of times a week Exercise: continue regular physical activity  Last Dental Exam: up to date  Last Eye Exam: he is due for a visit   Depression: phq 9 is negative    05/08/2023    7:54 AM 07/31/2022   10:09 AM 05/03/2022    7:58 AM 12/19/2021   12:38 PM 08/13/2021    9:42 AM  Depression screen PHQ 2/9  Decreased Interest 0 0 0 0 0  Down, Depressed, Hopeless 0 0 1 0 0  PHQ - 2 Score 0 0 1 0 0  Altered sleeping 0 0 1  0  Tired, decreased energy 0 0 0  0  Change in appetite 0 0 0  0  Feeling bad or failure about yourself  0 0 0  0  Trouble concentrating 0 0 0  0  Moving slowly or fidgety/restless 0 0 0  0  Suicidal thoughts 0 0 0  0  PHQ-9 Score 0 0 2  0  Difficult doing work/chores Not difficult at all  Not difficult at all  Not difficult at all    Hypertension:  BP Readings from Last 3 Encounters:   05/08/23 120/70  07/31/22 124/66  05/03/22 132/84    Obesity: Wt Readings from Last 3 Encounters:  05/08/23 258 lb 14.4 oz (117.4 kg)  07/31/22 257 lb (116.6 kg)  05/03/22 258 lb 8 oz (117.3 kg)   BMI Readings from Last 3 Encounters:  05/08/23 34.16 kg/m  07/31/22 33.91 kg/m  05/03/22 34.10 kg/m     Lipids:  Lab Results  Component Value Date   CHOL 126 07/31/2022   CHOL 146 05/03/2022   CHOL 147 02/05/2021   Lab Results  Component Value Date   HDL 47 07/31/2022   HDL 42 05/03/2022   HDL 46 02/05/2021   Lab Results  Component Value Date   LDLCALC 54 07/31/2022   LDLCALC 84 05/03/2022   LDLCALC 78 02/05/2021   Lab Results  Component Value Date   TRIG 172 (H) 07/31/2022   TRIG 103 05/03/2022   TRIG 130 02/05/2021  Lab Results  Component Value Date   CHOLHDL 2.7 07/31/2022   CHOLHDL 3.5 05/03/2022   CHOLHDL 3.2 02/05/2021   No results found for: "LDLDIRECT" Glucose:  Glucose, Bld  Date Value Ref Range Status  07/31/2022 94 65 - 99 mg/dL Final    Comment:    .            Fasting reference interval .   05/03/2022 91 65 - 99 mg/dL Final    Comment:    .            Fasting reference interval .   02/05/2021 94 65 - 99 mg/dL Final    Comment:    .            Fasting reference interval .     Flowsheet Row Office Visit from 05/08/2023 [facility]  AUDIT-C Score 7       Married STD testing and prevention (HIV/chl/gon/syphilis): N/A Sexual history: married one partner, wife had sexual problems but they are doing very well now  Hep C Screening: 07/17/12 Skin cancer: Discussed monitoring for atypical lesions Colorectal cancer: 03/01/21 Prostate cancer:   Lab Results  Component Value Date   PSA 0.56 05/03/2022   PSA 0.41 02/05/2021   PSA 0.7 03/18/2019     Lung cancer:  Low Dose CT Chest recommended if Age 84-80 years, 30 pack-year currently smoking OR have quit w/in 15years. Patient  no a candidate for  screening   AAA: The USPSTF recommends one-time screening with ultrasonography in men ages 53 to 75 years who have ever smoked. Patient   no, a candidate for screening  ECG:  09/12/14  Vaccines:   RSV: discussed with patient  Tdap: up to date Shingrix: up to date Pneumonia: discussed getting it when he turns 62 yo  Flu: up to date COVID-19: up to date  Advanced Care Planning: A voluntary discussion about advance care planning including the explanation and discussion of advance directives.  Discussed health care proxy and Living will, and the patient was able to identify a health care proxy as wife .  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   Chronic meniscal tear of knee 08/15/2015   Dyslipidemia 08/14/2015   Hypogonadism male 08/14/2015   Metabolic syndrome 08/14/2015   Depression with anxiety 08/14/2015   OSA on CPAP 08/14/2015   S/P arthroscopic surgery of left knee 08/14/2015   Umbilical hernia without obstruction and without gangrene 08/14/2015   Left Achilles tendinitis 08/14/2015    Past Surgical History:  Procedure Laterality Date   COLONOSCOPY WITH PROPOFOL N/A 03/01/2021   Procedure: COLONOSCOPY WITH PROPOFOL;  Surgeon: Pasty Spillers, MD;  Location: [facility] ENDOSCOPY;  Service: Endoscopy;  Laterality: N/A;   KNEE SURGERY Left 08/2014   VASECTOMY      Family History  Problem Relation Age of Onset   Cancer Mother    Heart disease Father    Stroke Father     Social History   Socioeconomic History   Marital status: Married    Spouse name: [name]    Number of children: 2   Years of education: Not on file  Highest education level: Associate degree: academic program   Occupational History   Occupation: Production assistant, radio   Tobacco Use   Smoking status: Never   Smokeless tobacco: Never  Vaping Use   Vaping Use: Never used  Substance and Sexual Activity   Alcohol use: Yes    Alcohol/week: 6.0 standard drinks of alcohol    Types: 6 Glasses of wine per week    Comment: Wine with dinner nightly   Drug use: No   Sexual activity: Yes    Birth control/protection: Surgical, None  Other Topics Concern   Not on file  Social History Narrative   Not on file   Social Determinants of Health   Financial Resource Strain: Low Risk  (05/08/2023)   Overall Financial Resource Strain (CARDIA)    Difficulty of Paying Living Expenses: Not hard at all  Food Insecurity: No Food Insecurity (05/08/2023)   Hunger Vital Sign    Worried About Running Out of Food in the Last Year: Never true    Ran Out of Food in the Last Year: Never true  Transportation Needs: No Transportation Needs (05/08/2023)   PRAPARE - Administrator, Civil Service (Medical): No    Lack of Transportation (Non-Medical): No  Physical Activity: Sufficiently Active (05/08/2023)   Exercise Vital Sign    Days of Exercise per Week: 5 days    Minutes of Exercise per Session: 40 min  Stress: Stress Concern Present (05/08/2023)   [employer] of Occupational Health - Occupational Stress Questionnaire    Feeling of Stress : To some extent  Social Connections: Socially Integrated (05/08/2023)   Social Connection and Isolation Panel [NHANES]    Frequency of Communication with Friends and Family: More than three times a week    Frequency of Social Gatherings with Friends and Family: More than three times a week    Attends Religious Services: More than 4 times per year    Active Member of [employer] or Organizations: Yes    Attends Engineer, structural: More than 4 times per year    Marital Status: Married  Catering manager Violence: Not At Risk (05/08/2023)   Humiliation, Afraid,  Rape, and Kick questionnaire    Fear of Current or Ex-Partner: No    Emotionally Abused: No    Physically Abused: No    Sexually Abused: No     Current Outpatient Medications:    cyanocobalamin 1000 MCG tablet, Take 1,000 mcg by mouth daily., Disp: , Rfl:    rosuvastatin (CRESTOR) 20 MG tablet, TAKE 1 TABLET BY MOUTH EVERY DAY, Disp: 90 tablet, Rfl: 2   tadalafil (CIALIS) 10 MG tablet, Take 1-2 tablets (10-20 mg total) by mouth every 3 (three) days as needed for erectile dysfunction., Disp: 90 tablet, Rfl: 0   meloxicam (MOBIC) 15 MG tablet, Take 1 tablet (15 mg total) by mouth daily as needed for pain. (Patient not taking: Reported on 05/08/2023), Disp: 45 tablet, Rfl: 1  No Known Allergies   ROS  Constitutional: Negative for fever or weight change.  Respiratory: Negative for cough and shortness of breath.   Cardiovascular: Negative for chest pain or palpitations.  Gastrointestinal: Negative for abdominal pain, no bowel changes.  Musculoskeletal: Negative for gait problem or joint swelling.  Skin: Negative for rash.  Neurological: Negative for dizziness or headache.  No other specific complaints in a complete review of systems (except as listed in HPI above).    Objective  Vitals:   05/08/23 0757  BP: 120/70  Pulse: 88  Resp: 16  Temp: 98.1 F (36.7 C)  TempSrc: Oral  SpO2: 97%  Weight: 258 lb 14.4 oz (117.4 kg)  Height: 6\' 1"  (1.854 m)    Body mass index is 34.16 kg/m.  Physical Exam  Constitutional: Patient appears well-developed and well-nourished. No distress.  HENT: Head: Normocephalic and atraumatic. Ears: B TMs ok, no erythema or effusion; Nose: Nose normal. Mouth/Throat: Oropharynx is clear and moist. No oropharyngeal exudate.  Eyes: Conjunctivae and EOM are normal. Pupils are equal, round, and reactive to light. No scleral icterus.  Neck: Normal range of motion. Neck supple. No JVD present. No thyromegaly present.  Cardiovascular: Normal rate, regular  rhythm and normal heart sounds.  No murmur heard. No BLE edema. Pulmonary/Chest: Effort normal and breath sounds normal. No respiratory distress. Abdominal: Soft. Bowel sounds are normal, no distension. There is no tenderness. no masses MALE GENITALIA: Normal descended testes bilaterally, no masses palpated, no hernias, no lesions, no discharge RECTAL: not done  Musculoskeletal: Normal range of motion, no joint effusions. No gross deformities Neurological: he is alert and oriented to person, place, and time. No cranial nerve deficit. Coordination, balance, strength, speech and gait are normal.  Skin: Skin is warm and dry. No rash noted. No erythema.  Psychiatric: Patient has a normal mood and affect. behavior is normal. Judgment and thought content normal.    Fall Risk:    05/08/2023    7:54 AM 07/31/2022   10:09 AM 05/03/2022    7:58 AM 12/19/2021   12:38 PM 08/13/2021    9:41 AM  Fall Risk   Falls in the past year? 1 0 0 0 0  Number falls in past yr: 0 0  0 0  Injury with Fall? 0 0  0 0  Risk for fall due to : Impaired balance/gait No Fall Risks No Fall Risks    Follow up Falls prevention discussed;Education provided;Falls evaluation completed Falls prevention discussed Falls prevention discussed;Falls evaluation completed;Education provided Falls evaluation completed      Functional Status Survey: Is the patient deaf or have difficulty hearing?: No Does the patient have difficulty seeing, even when wearing glasses/contacts?: No Does the patient have difficulty concentrating, remembering, or making decisions?: No Does the patient have difficulty walking or climbing stairs?: No Does the patient have difficulty dressing or bathing?: No Does the patient have difficulty doing errands alone such as visiting a doctor's office or shopping?: No    Assessment & Plan  1. Well adult exam  Discussed labs but since he has a high risk deductible plan we will hold off until he gets another  insurance   Reviewed last labs     -Prostate cancer screening and PSA options (with potential risks and benefits of testing vs not testing) were discussed along with recent recs/guidelines. -USPSTF grade A and B recommendations reviewed with patient; age-appropriate recommendations, preventive care, screening tests, etc discussed and encouraged; healthy living encouraged; see AVS for patient education given to patient -Discussed importance of 150 minutes of physical activity weekly, eat two servings of fish weekly, eat one serving of tree nuts ( cashews, pistachios, pecans, almonds.Marland Kitchen) every other day, eat 6 servings of fruit/vegetables daily and drink plenty of water and avoid sweet beverages.  -Reviewed Health Maintenance: yes

## 2023-05-07 NOTE — Patient Instructions (Signed)

## 2023-05-08 ENCOUNTER — Ambulatory Visit (INDEPENDENT_AMBULATORY_CARE_PROVIDER_SITE_OTHER): Payer: 59 | Admitting: Family Medicine

## 2023-05-08 ENCOUNTER — Encounter: Payer: Self-pay | Admitting: Family Medicine

## 2023-05-08 VITALS — BP 120/70 | HR 88 | Temp 98.1°F | Resp 16 | Ht 73.0 in | Wt 258.9 lb

## 2023-05-08 DIAGNOSIS — Z0000 Encounter for general adult medical examination without abnormal findings: Secondary | ICD-10-CM | POA: Diagnosis not present

## 2023-08-01 ENCOUNTER — Other Ambulatory Visit: Payer: Self-pay | Admitting: Family Medicine

## 2023-08-13 NOTE — Progress Notes (Unsigned)
Name: Willie Atkins   MRN: [id number]    DOB: 12-08-1961   Date:08/14/2023  Virtual Visit via Video Note  I connected with Willie Atkins on 08/14/23 at  8:00 AM EDT by a video enabled telemedicine application and verified that I am speaking with the correct person using two identifiers.   Chief Complaint  Scheduled for a visit but was sick   HPI  COVID-19: exposed by his sister-in-law. He developed some sore throat , followed by fever , mild cough, body aches, mild SOB.  He denies/Nausea/vomiting or change in appetite   Pulse ox right now is 95 %   Patient Active Problem List   Diagnosis Date Noted   Pain of right forearm 04/15/2022   Arthritis 08/13/2021   Polyp of colon    Breast tenderness in male 08/31/2015   Gynecomastia, male 08/31/2015   Allergic rhinitis 08/15/2015   Mitral valve disorder 08/15/2015   Chronic meniscal tear of knee 08/15/2015   Dyslipidemia 08/14/2015   Hypogonadism male 08/14/2015   Metabolic syndrome 08/14/2015   Depression with anxiety 08/14/2015   OSA on CPAP 08/14/2015   S/P arthroscopic surgery of left knee 08/14/2015   Umbilical hernia without obstruction and without gangrene 08/14/2015   Left Achilles tendinitis 08/14/2015    Past Surgical History:  Procedure Laterality Date   COLONOSCOPY WITH PROPOFOL N/A 03/01/2021   Procedure: COLONOSCOPY WITH PROPOFOL;  Surgeon: Pasty Spillers, MD;  Location: [facility] ENDOSCOPY;  Service: Endoscopy;  Laterality: N/A;   KNEE SURGERY Left 08/2014   VASECTOMY      Family History  Problem Relation Age of Onset   Cancer Mother    Heart disease Father    Stroke Father     Social History   Tobacco Use   Smoking status: Never   Smokeless tobacco: Never  Substance Use Topics   Alcohol use: Yes    Alcohol/week: 6.0 standard drinks of alcohol    Types: 6 Glasses of wine per week    Comment: Wine with dinner nightly     Current Outpatient Medications:    cyanocobalamin 1000 MCG tablet, Take  1,000 mcg by mouth daily., Disp: , Rfl:    rosuvastatin (CRESTOR) 20 MG tablet, TAKE 1 TABLET BY MOUTH EVERY DAY, Disp: 30 tablet, Rfl: 0   tadalafil (CIALIS) 10 MG tablet, Take 1-2 tablets (10-20 mg total) by mouth every 3 (three) days as needed for erectile dysfunction., Disp: 90 tablet, Rfl: 0  No Known Allergies  I personally reviewed active problem list, medication list, allergies, family history with the patient/caregiver today.   ROS  Ten systems reviewed and is negative except as mentioned in HPI    Objective  There were no vitals filed for this visit.  There is no height or weight on file to calculate BMI.  Physical Exam  Awake, alert and oriented   PHQ2/9:    05/08/2023    7:54 AM 07/31/2022   10:09 AM 05/03/2022    7:58 AM 12/19/2021   12:38 PM 08/13/2021    9:42 AM  Depression screen PHQ 2/9  Decreased Interest 0 0 0 0 0  Down, Depressed, Hopeless 0 0 1 0 0  PHQ - 2 Score 0 0 1 0 0  Altered sleeping 0 0 1  0  Tired, decreased energy 0 0 0  0  Change in appetite 0 0 0  0  Feeling bad or failure about yourself  0 0 0  0  Trouble concentrating 0  0 0  0  Moving slowly or fidgety/restless 0 0 0  0  Suicidal thoughts 0 0 0  0  PHQ-9 Score 0 0 2  0  Difficult doing work/chores Not difficult at all  Not difficult at all  Not difficult at all    phq 9 is negative   Fall Risk:    05/08/2023    7:54 AM 07/31/2022   10:09 AM 05/03/2022    7:58 AM 12/19/2021   12:38 PM 08/13/2021    9:41 AM  Fall Risk   Falls in the past year? 1 0 0 0 0  Number falls in past yr: 0 0  0 0  Injury with Fall? 0 0  0 0  Risk for fall due to : Impaired balance/gait No Fall Risks No Fall Risks    Follow up Falls prevention discussed;Education provided;Falls evaluation completed Falls prevention discussed Falls prevention discussed;Falls evaluation completed;Education provided Falls evaluation completed     Assessment & Plan  1. COVID-19  - chlorpheniramine-HYDROcodone (TUSSIONEX) 10-8  MG/5ML; Take 5 mLs by mouth every 12 (twelve) hours as needed for cough.  Dispense: 140 mL; Refill: 0  2. Acute cough  - chlorpheniramine-HYDROcodone (TUSSIONEX) 10-8 MG/5ML; Take 5 mLs by mouth every 12 (twelve) hours as needed for cough.  Dispense: 140 mL; Refill: 0   Fluids , rest. Isolation, call back if no improvement or increase in SOB    I discussed the assessment and treatment plan with the patient. The patient was provided an opportunity to ask questions and all were answered. The patient agreed with the plan and demonstrated an understanding of the instructions.   The patient was advised to call back or seek an in-person evaluation if the symptoms worsen or if the condition fails to improve as anticipated.  I provided 15  minutes of non-face-to-face time during this encounter.   Ruel Favors, MD

## 2023-08-14 ENCOUNTER — Encounter: Payer: Self-pay | Admitting: Family Medicine

## 2023-08-14 ENCOUNTER — Telehealth (INDEPENDENT_AMBULATORY_CARE_PROVIDER_SITE_OTHER): Payer: 59 | Admitting: Family Medicine

## 2023-08-14 DIAGNOSIS — U071 COVID-19: Secondary | ICD-10-CM

## 2023-08-14 DIAGNOSIS — R051 Acute cough: Secondary | ICD-10-CM | POA: Diagnosis not present

## 2023-08-14 MED ORDER — HYDROCOD POLI-CHLORPHE POLI ER 10-8 MG/5ML PO SUER: 5.0000 mL | Freq: Two times a day (BID) | ORAL | 0 refills | Status: DC | PRN

## 2023-08-26 ENCOUNTER — Other Ambulatory Visit: Payer: Self-pay | Admitting: Family Medicine

## 2023-09-09 ENCOUNTER — Other Ambulatory Visit: Payer: Self-pay | Admitting: Family Medicine

## 2023-09-09 DIAGNOSIS — N528 Other male erectile dysfunction: Secondary | ICD-10-CM

## 2023-09-09 MED ORDER — TADALAFIL 10 MG PO TABS: 10.0000 mg | ORAL_TABLET | ORAL | 0 refills | Status: DC | PRN

## 2023-11-26 ENCOUNTER — Ambulatory Visit
Admission: EM | Admit: 2023-11-26 | Discharge: 2023-11-26 | Disposition: A | Discharge status: Home / Self Care | Payer: 59 | Attending: Emergency Medicine | Admitting: Emergency Medicine

## 2023-11-26 DIAGNOSIS — J069 Acute upper respiratory infection, unspecified: Secondary | ICD-10-CM

## 2023-11-26 LAB — POCT RAPID STREP A (OFFICE): Rapid Strep A Screen: NEGATIVE

## 2023-11-26 MED ORDER — AMOXICILLIN-POT CLAVULANATE 875-125 MG PO TABS: 1.0000 | ORAL_TABLET | Freq: Two times a day (BID) | ORAL | 0 refills | Status: AC

## 2023-11-26 NOTE — ED Triage Notes (Signed)
Patient presents to [facility] for sore throat, cough, post nasal drip x 11/14. Treating symptoms with mucinex DM, tessalon.

## 2023-11-26 NOTE — Discharge Instructions (Addendum)
Begin Augmentin every morning and every evening for 10 days provide coverage for bacteria which is most likely causing your symptoms to prolong, do not believe that you have pneumonia or bronchitis at this time as your lungs are clear when listen to and you are getting enough air without assistance, you are also not experiencing shortness of breath or wheezing    You can take Tylenol and/or Ibuprofen as needed for fever reduction and pain relief.   For cough: honey 1/2 to 1 teaspoon (you can dilute the honey in water or another fluid).  You can also use guaifenesin and dextromethorphan for cough. You can use a humidifier for chest congestion and cough.  If you don't have a humidifier, you can sit in the bathroom with the hot shower running.      For sore throat: try warm salt water gargles, cepacol lozenges, throat spray, warm tea or water with lemon/honey, popsicles or ice, or OTC cold relief medicine for throat discomfort.   For congestion: take a daily anti-histamine like Zyrtec, Claritin, and a oral decongestant, such as pseudoephedrine.  You can also use Flonase 1-2 sprays in each nostril daily.   It is important to stay hydrated: drink plenty of fluids (water, gatorade/powerade/pedialyte, juices, or teas) to keep your throat moisturized and help further relieve irritation/discomfort.

## 2023-11-26 NOTE — ED Provider Notes (Signed)
Willie Atkins    CSN: [id number] Arrival date & time: 11/26/23  1037      History   Chief Complaint Chief Complaint  Patient presents with   Cough   Sore Throat    HPI Willie Atkins is a 62 y.o. male.   Patient presents for evaluation of nasal congestion, rhinorrhea and primarily dry cough, postnasal drip, sore throat present for approximately 1 month.  Symptoms began on 10/30/2023 while on a cruise, at that time experiencing fever which has resolved.  No known sick contacts overtly.  Tolerating food and liquids.  Has been using Mucinex DM and Tessalon.  Past Medical History:  Diagnosis Date   Allergy    Anxiety    Arthritis    Depression    Elevated hematocrit    Heart murmur 1978   Hematuria syndrome    Hyperlipidemia    Insomnia    Left knee pain    Obesity    Old tear of meniscus of left knee    OSA (obstructive sleep apnea)    Pain medication agreement    Sleep apnea    Tendonitis, Achilles, left    Testicular failure     Patient Active Problem List   Diagnosis Date Noted   Pain of right forearm 04/15/2022   Arthritis 08/13/2021   Polyp of colon    Breast tenderness in male 08/31/2015   Gynecomastia, male 08/31/2015   Allergic rhinitis 08/15/2015   Mitral valve disorder 08/15/2015   Chronic meniscal tear of knee 08/15/2015   Dyslipidemia 08/14/2015   Hypogonadism male 08/14/2015   Metabolic syndrome 08/14/2015   Depression with anxiety 08/14/2015   OSA on CPAP 08/14/2015   S/P arthroscopic surgery of left knee 08/14/2015   Umbilical hernia without obstruction and without gangrene 08/14/2015   Left Achilles tendinitis 08/14/2015    Past Surgical History:  Procedure Laterality Date   COLONOSCOPY WITH PROPOFOL N/A 03/01/2021   Procedure: COLONOSCOPY WITH PROPOFOL;  Surgeon: Pasty Spillers, MD;  Location: [facility] ENDOSCOPY;  Service: Endoscopy;  Laterality: N/A;   KNEE SURGERY Left 08/2014   VASECTOMY         Home Medications     Prior to Admission medications   Medication Sig Start Date End Date Taking? Authorizing Provider  amoxicillin-clavulanate (AUGMENTIN) 875-125 MG tablet Take 1 tablet by mouth every 12 (twelve) hours for 10 days. 11/26/23 12/06/23 Yes Muaz Shorey R, NP  chlorpheniramine-HYDROcodone (TUSSIONEX) 10-8 MG/5ML Take 5 mLs by mouth every 12 (twelve) hours as needed for cough. 08/14/23   Alba Cory, MD  cyanocobalamin 1000 MCG tablet Take 1,000 mcg by mouth daily.    [provider]  rosuvastatin (CRESTOR) 20 MG tablet TAKE 1 TABLET BY MOUTH EVERY DAY 08/26/23   Alba Cory, MD  tadalafil (CIALIS) 10 MG tablet Take 1-2 tablets (10-20 mg total) by mouth every 3 (three) days as needed for erectile dysfunction. 09/09/23   Alba Cory, MD    Family History Family History  Problem Relation Age of Onset   Cancer Mother    Heart disease Father    Stroke Father     Social History Social History   Tobacco Use   Smoking status: Never   Smokeless tobacco: Never  Vaping Use   Vaping status: Never Used  Substance Use Topics   Alcohol use: Yes    Alcohol/week: 6.0 standard drinks of alcohol    Types: 6 Glasses of wine per week    Comment: Wine with dinner  nightly   Drug use: No     Allergies   Patient has no known allergies.   Review of Systems Review of Systems   Physical Exam Triage Vital Signs ED Triage Vitals  Encounter Vitals Group     BP 11/26/23 1140 (!) 155/66     Systolic BP Percentile --      Diastolic BP Percentile --      Pulse Rate 11/26/23 1140 78     Resp 11/26/23 1140 16     Temp 11/26/23 1140 97.9 F (36.6 C)     Temp Source 11/26/23 1140 Temporal     SpO2 11/26/23 1140 96 %     Weight --      Height --      Head Circumference --      Peak Flow --      Pain Score 11/26/23 1139 2     Pain Loc --      Pain Education --      Exclude from Growth Chart --    No data found.  Updated Vital Signs BP (!) 155/66 (BP Location: Left Arm)    Pulse 78   Temp 97.9 F (36.6 C) (Temporal)   Resp 16   SpO2 96%   Visual Acuity Right Eye Distance:   Left Eye Distance:   Bilateral Distance:    Right Eye Near:   Left Eye Near:    Bilateral Near:     Physical Exam Constitutional:      Appearance: Normal appearance.  HENT:     Head: Normocephalic.     Right Ear: Tympanic membrane, ear canal and external ear normal.     Left Ear: Tympanic membrane, ear canal and external ear normal.     Nose: Congestion present. No rhinorrhea.     Mouth/Throat:     Mouth: Mucous membranes are moist.     Pharynx: Oropharynx is clear. No oropharyngeal exudate or posterior oropharyngeal erythema.  Eyes:     Extraocular Movements: Extraocular movements intact.  Cardiovascular:     Rate and Rhythm: Normal rate and regular rhythm.     Pulses: Normal pulses.     Heart sounds: Normal heart sounds.  Pulmonary:     Effort: Pulmonary effort is normal.     Breath sounds: Normal breath sounds.  Musculoskeletal:     Cervical back: Normal range of motion and neck supple.  Skin:    General: Skin is warm and dry.  Neurological:     Mental Status: He is alert and oriented to person, place, and time. Mental status is at baseline.      UC Treatments / Results  Labs (all labs ordered are listed, but only abnormal results are displayed) Labs Reviewed  POCT RAPID STREP A (OFFICE)    EKG   Radiology No results found.  Procedures Procedures (including critical care time)  Medications Ordered in UC Medications - No data to display  Initial Impression / Assessment and Plan / UC Course  I have reviewed the triage vital signs and the nursing notes.  Pertinent labs & imaging results that were available during my care of the patient were reviewed by me and considered in my medical decision making (see chart for details).  Acute URI  Patient is in no signs of distress nor toxic appearing.  Vital signs are stable.  Low suspicion for pneumonia,  pneumothorax or bronchitis and therefore will defer imaging.  Rapid strep test negative.  Prescribed Augmentin as symptoms have  been present for 1 month without signs of resolution.May use additional over-the-counter medications as needed for supportive care.  May follow-up with urgent care as needed if symptoms persist or worsen.   Final Clinical Impressions(s) / UC Diagnoses   Final diagnoses:  Acute URI     Discharge Instructions      Begin Augmentin every morning and every evening for 10 days provide coverage for bacteria which is most likely causing your symptoms to prolong, do not believe that you have pneumonia or bronchitis at this time as your lungs are clear when listen to and you are getting enough air without assistance, you are also not experiencing shortness of breath or wheezing    You can take Tylenol and/or Ibuprofen as needed for fever reduction and pain relief.   For cough: honey 1/2 to 1 teaspoon (you can dilute the honey in water or another fluid).  You can also use guaifenesin and dextromethorphan for cough. You can use a humidifier for chest congestion and cough.  If you don't have a humidifier, you can sit in the bathroom with the hot shower running.      For sore throat: try warm salt water gargles, cepacol lozenges, throat spray, warm tea or water with lemon/honey, popsicles or ice, or OTC cold relief medicine for throat discomfort.   For congestion: take a daily anti-histamine like Zyrtec, Claritin, and a oral decongestant, such as pseudoephedrine.  You can also use Flonase 1-2 sprays in each nostril daily.   It is important to stay hydrated: drink plenty of fluids (water, gatorade/powerade/pedialyte, juices, or teas) to keep your throat moisturized and help further relieve irritation/discomfort.     ED Prescriptions     Medication Sig Dispense Auth. Provider   amoxicillin-clavulanate (AUGMENTIN) 875-125 MG tablet Take 1 tablet by mouth every 12 (twelve) hours  for 10 days. 20 tablet Valinda Hoar, NP      PDMP not reviewed this encounter.   Valinda Hoar, NP 11/26/23 1216

## 2024-01-13 ENCOUNTER — Encounter: Payer: Self-pay | Admitting: Family Medicine

## 2024-01-21 ENCOUNTER — Telehealth: Payer: Self-pay | Admitting: Family Medicine

## 2024-01-21 NOTE — Telephone Encounter (Signed)
 Copied from CRM [id number]. Topic: General - Other >> Jan 21, 2024 11:41 AM Tobias CROME wrote: Reason for CRM: Elspeth following up on order request for CPAP supplies. Also requesting last office visit of patient. Request was faxed to office, 01/19/2024. Elspeth will fax request again today.   Callback number: [phone]

## 2024-01-21 NOTE — Telephone Encounter (Signed)
 Contacted [employer] to ensure the form we are receiving from [employer] its there supplier. I spoke to Mariah Shines from [facility] and stated they are there supplier.

## 2024-01-22 ENCOUNTER — Encounter: Payer: Self-pay | Admitting: Family Medicine

## 2024-01-22 ENCOUNTER — Ambulatory Visit (INDEPENDENT_AMBULATORY_CARE_PROVIDER_SITE_OTHER): Payer: Medicare Other | Admitting: Family Medicine

## 2024-01-22 VITALS — BP 134/82 | HR 91 | Temp 98.1°F | Resp 16 | Ht 73.0 in | Wt 256.0 lb

## 2024-01-22 DIAGNOSIS — E785 Hyperlipidemia, unspecified: Secondary | ICD-10-CM

## 2024-01-22 DIAGNOSIS — G4733 Obstructive sleep apnea (adult) (pediatric): Secondary | ICD-10-CM

## 2024-01-22 DIAGNOSIS — R739 Hyperglycemia, unspecified: Secondary | ICD-10-CM | POA: Diagnosis not present

## 2024-01-22 DIAGNOSIS — E538 Deficiency of other specified B group vitamins: Secondary | ICD-10-CM

## 2024-01-22 DIAGNOSIS — N528 Other male erectile dysfunction: Secondary | ICD-10-CM

## 2024-01-22 DIAGNOSIS — M1990 Unspecified osteoarthritis, unspecified site: Secondary | ICD-10-CM

## 2024-01-22 DIAGNOSIS — Z79899 Other long term (current) drug therapy: Secondary | ICD-10-CM

## 2024-01-22 DIAGNOSIS — R600 Localized edema: Secondary | ICD-10-CM

## 2024-01-22 MED ORDER — TADALAFIL 10 MG PO TABS: 10.0000 mg | ORAL_TABLET | ORAL | 0 refills | Status: DC | PRN

## 2024-01-22 NOTE — Progress Notes (Signed)
 Name: Willie Atkins   MRN: [id number]    DOB: March 25, 1961   Date:01/22/2024       Progress Note  Subjective  Chief Complaint  Chief Complaint  Patient presents with   Medical Management of Chronic Issues   HPI   ED: using Cialis  and able to start and maintain erections, needs a refill today. No side effects   OA right foot/Neck OA : he is now running since he has Medicare and a new gym membership. He states not currently taking any medications, discussed topical medication   Hypogonadism: no longer taking testosterone since able to have an erection with Cialis  and has normal libido . Unchanged   Dyslipidemia: taking statin therapy and denies side effects of medications, we will recheck labs today   Hyperglycemia : last A1C was at goal, he lost weight but is gradually gaining weight again, he just joined a gym and is going to talk to personal traine    OSA: he is compliant with machine - wears it every night all night long He is on auto pap 9-20 cmH2O. He states pressure usually at 12 cmH2O, he has an app and reviewed with patient, compliance is 100 % over the past month. He has been using CPAP for 13 years. He denies morning headaches, wakes up feeling refreshed    Patient Active Problem List   Diagnosis Date Noted   Pain of right forearm 04/15/2022   Arthritis 08/13/2021   Polyp of colon    Breast tenderness in male 08/31/2015   Gynecomastia, male 08/31/2015   Allergic rhinitis 08/15/2015   Mitral valve disorder 08/15/2015   Chronic meniscal tear of knee 08/15/2015   Dyslipidemia 08/14/2015   Hypogonadism male 08/14/2015   Metabolic syndrome 08/14/2015   Depression with anxiety 08/14/2015   OSA on CPAP 08/14/2015   S/P arthroscopic surgery of left knee 08/14/2015   Umbilical hernia without obstruction and without gangrene 08/14/2015   Left Achilles tendinitis 08/14/2015    Past Surgical History:  Procedure Laterality Date   COLONOSCOPY WITH PROPOFOL  N/A 03/01/2021    Procedure: COLONOSCOPY WITH PROPOFOL ;  Surgeon: Janalyn Keene NOVAK, MD;  Location: [facility] ENDOSCOPY;  Service: Endoscopy;  Laterality: N/A;   KNEE SURGERY Left 08/2014   VASECTOMY      Family History  Problem Relation Age of Onset   Cancer Mother    Heart disease Father    Stroke Father     Social History   Tobacco Use   Smoking status: Never   Smokeless tobacco: Never  Substance Use Topics   Alcohol use: Yes    Alcohol/week: 6.0 standard drinks of alcohol    Types: 6 Glasses of wine per week    Comment: Wine with dinner nightly     Current Outpatient Medications:    cyanocobalamin 1000 MCG tablet, Take 1,000 mcg by mouth daily., Disp: , Rfl:    rosuvastatin  (CRESTOR ) 20 MG tablet, TAKE 1 TABLET BY MOUTH EVERY DAY, Disp: 90 tablet, Rfl: 1   tadalafil  (CIALIS ) 10 MG tablet, Take 1-2 tablets (10-20 mg total) by mouth every 3 (three) days as needed for erectile dysfunction., Disp: 30 tablet, Rfl: 0   chlorpheniramine-HYDROcodone (TUSSIONEX) 10-8 MG/5ML, Take 5 mLs by mouth every 12 (twelve) hours as needed for cough., Disp: 140 mL, Rfl: 0  No Known Allergies  I personally reviewed active problem list, medication list, allergies, family history with the patient/caregiver today.   ROS  Ten systems reviewed and is negative except as mentioned  in HPI    Objective  Vitals:   01/22/24 0918  BP: 134/82  Pulse: 91  Resp: 16  Temp: 98.1 F (36.7 C)  TempSrc: Oral  SpO2: 98%  Weight: 256 lb (116.1 kg)  Height: 6' 1 (1.854 m)    Body mass index is 33.78 kg/m.  Physical Exam  Constitutional: Patient appears well-developed and well-nourished. Obese  No distress.  HEENT: head atraumatic, normocephalic, pupils equal and reactive to light, neck supple Cardiovascular: Normal rate, regular rhythm and normal heart sounds.  No murmur heard. No BLE edema. Pulmonary/Chest: Effort normal and breath sounds normal. No respiratory distress. Abdominal: Soft.  There is no  tenderness. Psychiatric: Patient has a normal mood and affect. behavior is normal. Judgment and thought content normal.   Recent Results (from the past 2160 hours)  POCT rapid strep A     Status: None   Collection Time: 11/26/23 11:57 AM  Result Value Ref Range   Rapid Strep A Screen Negative Negative    Diabetic Foot Exam:     PHQ2/9:    01/22/2024    9:17 AM 05/08/2023    7:54 AM 07/31/2022   10:09 AM 05/03/2022    7:58 AM 12/19/2021   12:38 PM  Depression screen PHQ 2/9  Decreased Interest 0 0 0 0 0  Down, Depressed, Hopeless 0 0 0 1 0  PHQ - 2 Score 0 0 0 1 0  Altered sleeping 0 0 0 1   Tired, decreased energy 0 0 0 0   Change in appetite 0 0 0 0   Feeling bad or failure about yourself  0 0 0 0   Trouble concentrating 0 0 0 0   Moving slowly or fidgety/restless 0 0 0 0   Suicidal thoughts 0 0 0 0   PHQ-9 Score 0 0 0 2   Difficult doing work/chores Not difficult at all Not difficult at all  Not difficult at all     phq 9 is negative  Fall Risk:    01/22/2024    9:17 AM 05/08/2023    7:54 AM 07/31/2022   10:09 AM 05/03/2022    7:58 AM 12/19/2021   12:38 PM  Fall Risk   Falls in the past year? 0 1 0 0 0  Number falls in past yr: 0 0 0  0  Injury with Fall? 0 0 0  0  Risk for fall due to : No Fall Risks Impaired balance/gait No Fall Risks No Fall Risks   Follow up Falls prevention discussed;Education provided;Falls evaluation completed Falls prevention discussed;Education provided;Falls evaluation completed Falls prevention discussed Falls prevention discussed;Falls evaluation completed;Education provided Falls evaluation completed     Assessment & Plan  1. OSA on CPAP (Primary)  - CBC with Differential/Platelet  2. Dyslipidemia  - Lipid panel  3. B12 deficiency  - CBC with Differential/Platelet - B12 and Folate Panel  4. Hyperglycemia  - Hemoglobin A1c  5. Other male erectile dysfunction  - tadalafil  (CIALIS ) 10 MG tablet; Take 1-2 tablets (10-20 mg  total) by mouth every 3 (three) days as needed for erectile dysfunction.  Dispense: 90 tablet; Refill: 0  6. Arthritis  Stable  7. Long-term use of high-risk medication  - COMPLETE METABOLIC PANEL WITH GFR  8. Leg edema, right  Seen by vascular surgeon and negative work up, seen by podiatrist , Dr. Gershon and he thinks swelling secondary to OA of foot    He wears compression socks

## 2024-01-23 ENCOUNTER — Encounter: Payer: Self-pay | Admitting: Family Medicine

## 2024-01-23 LAB — COMPLETE METABOLIC PANEL WITH GFR
AG Ratio: 1.5 (calc) (ref 1.0–2.5)
ALT: 25 U/L (ref 9–46)
AST: 20 U/L (ref 10–35)
Albumin: 4.5 g/dL (ref 3.6–5.1)
Alkaline phosphatase (APISO): 61 U/L (ref 35–144)
BUN: 20 mg/dL (ref 7–25)
CO2: 23 mmol/L (ref 20–32)
Calcium: 9.8 mg/dL (ref 8.6–10.3)
Chloride: 107 mmol/L (ref 98–110)
Creat: 1 mg/dL (ref 0.70–1.35)
Globulin: 3 g/dL (ref 1.9–3.7)
Glucose, Bld: 80 mg/dL (ref 65–99)
Potassium: 4.5 mmol/L (ref 3.5–5.3)
Sodium: 141 mmol/L (ref 135–146)
Total Bilirubin: 1 mg/dL (ref 0.2–1.2)
Total Protein: 7.5 g/dL (ref 6.1–8.1)
eGFR: 84 mL/min/{1.73_m2} (ref >=60)

## 2024-01-23 LAB — LIPID PANEL
Cholesterol: 97 mg/dL (ref <200)
HDL: 41 mg/dL (ref >=40)
LDL Cholesterol (Calc): 37 mg/dL
Non-HDL Cholesterol (Calc): 56 mg/dL (ref <130)
Total CHOL/HDL Ratio: 2.4 (calc) (ref <5.0)
Triglycerides: 109 mg/dL (ref <150)

## 2024-01-23 LAB — B12 AND FOLATE PANEL
Folate: 10.6 ng/mL
Vitamin B-12: 653 pg/mL (ref 200–1100)

## 2024-01-23 LAB — CBC WITH DIFFERENTIAL/PLATELET
Absolute Lymphocytes: 2238 {cells}/uL (ref 850–3900)
Absolute Monocytes: 360 {cells}/uL (ref 200–950)
Basophils Absolute: 37 {cells}/uL (ref 0–200)
Basophils Relative: 0.6 %
Eosinophils Absolute: 149 {cells}/uL (ref 15–500)
Eosinophils Relative: 2.4 %
HCT: 48 % (ref 38.5–50.0)
Hemoglobin: 16.1 g/dL (ref 13.2–17.1)
MCH: 28.4 pg (ref 27.0–33.0)
MCHC: 33.5 g/dL (ref 32.0–36.0)
MCV: 84.8 fL (ref 80.0–100.0)
MPV: 10.9 fL (ref 7.5–12.5)
Monocytes Relative: 5.8 %
Neutro Abs: 3416 {cells}/uL (ref 1500–7800)
Neutrophils Relative %: 55.1 %
Platelets: 206 10*3/uL (ref 140–400)
RBC: 5.66 10*6/uL (ref 4.20–5.80)
RDW: 13.3 % (ref 11.0–15.0)
Total Lymphocyte: 36.1 %
WBC: 6.2 10*3/uL (ref 3.8–10.8)

## 2024-01-23 LAB — HEMOGLOBIN A1C
Hgb A1c MFr Bld: 5.6 %{Hb} (ref <5.7)
Mean Plasma Glucose: 114 mg/dL
eAG (mmol/L): 6.3 mmol/L

## 2024-01-23 NOTE — Telephone Encounter (Signed)
 I have printed it for PCP to review it

## 2024-02-09 ENCOUNTER — Telehealth: Payer: Self-pay | Admitting: Family Medicine

## 2024-02-09 NOTE — Telephone Encounter (Signed)
 There has been multiple orders received and faxed back with rx and office visit notes. It would be helpful if we can have a fax number who to send it back and ATTN to. Thanks

## 2024-02-09 NOTE — Telephone Encounter (Signed)
 Copied from CRM [id number]. Topic: General - Other >> Feb 09, 2024  2:06 PM Phill Myron wrote: Willie Atkins with [facility]  Requesting an rx  and office notes Product: CPAP supplies

## 2024-02-11 ENCOUNTER — Telehealth: Payer: Self-pay | Admitting: Family Medicine

## 2024-02-11 NOTE — Telephone Encounter (Signed)
 Copied from CRM [id number]. Topic: General - Other >> Feb 11, 2024 10:47 AM Kristie Cowman wrote: Reason for CRM: Ronaldo Miyamoto from [facility] is calling regarding obtaining doctor evaluation request and prescription request for the patient's CPAP supplies.    Best number to reach [facility] - [phone]

## 2024-02-11 NOTE — Telephone Encounter (Signed)
 Called Willie Atkins back no success spoke to a different agent unable to route me to [facility], we have faxed multiple rx orders and clinical notes back to them. If there is something specific please leave fax number who to fax it back and attn to.

## 2024-02-25 ENCOUNTER — Ambulatory Visit (INDEPENDENT_AMBULATORY_CARE_PROVIDER_SITE_OTHER): Payer: 59 | Admitting: Family Medicine

## 2024-02-25 ENCOUNTER — Encounter: Payer: Self-pay | Admitting: Family Medicine

## 2024-02-25 VITALS — BP 132/78 | HR 85 | Temp 98.3°F | Resp 16 | Ht 73.0 in | Wt 257.2 lb

## 2024-02-25 DIAGNOSIS — Z0000 Encounter for general adult medical examination without abnormal findings: Secondary | ICD-10-CM | POA: Diagnosis not present

## 2024-02-25 DIAGNOSIS — Z23 Encounter for immunization: Secondary | ICD-10-CM | POA: Diagnosis not present

## 2024-02-25 NOTE — Progress Notes (Signed)
 Subjective:    Willie Atkins is a 63 y.o. male who presents for a Welcome to Medicare exam.   Cardiac Risk Factors include: advanced age (>7men, >79 women);obesity (BMI >30kg/m2);dyslipidemia     Objective:    Today's Vitals   02/25/24 0757  BP: 132/78  Pulse: 85  Resp: 16  Temp: 98.3 F (36.8 C)  TempSrc: Oral  SpO2: 97%  Weight: 257 lb 3.2 oz (116.7 kg)  Height: 6\' 1"  (1.854 m)   Body mass index is 33.93 kg/m.  Medications Outpatient Encounter Medications as of 02/25/2024  Medication Sig   cyanocobalamin 1000 MCG tablet Take 1,000 mcg by mouth daily.   rosuvastatin (CRESTOR) 20 MG tablet TAKE 1 TABLET BY MOUTH EVERY DAY   tadalafil (CIALIS) 10 MG tablet Take 1-2 tablets (10-20 mg total) by mouth every 3 (three) days as needed for erectile dysfunction.   No facility-administered encounter medications on file as of 02/25/2024.     History: Past Medical History:  Diagnosis Date   Allergy    Anxiety    Arthritis    Depression    Elevated hematocrit    Heart murmur 1978   Hematuria syndrome    Hyperlipidemia    Insomnia    Left knee pain    Obesity    Old tear of meniscus of left knee    OSA (obstructive sleep apnea)    Pain medication agreement    Sleep apnea    Tendonitis, Achilles, left    Testicular failure    Past Surgical History:  Procedure Laterality Date   COLONOSCOPY WITH PROPOFOL N/A 03/01/2021   Procedure: COLONOSCOPY WITH PROPOFOL;  Surgeon: Pasty Spillers, MD;  Location: [facility] ENDOSCOPY;  Service: Endoscopy;  Laterality: N/A;   KNEE SURGERY Left 08/2014   VASECTOMY      Family History  Problem Relation Age of Onset   Cancer Mother    Heart disease Father    Stroke Father    Social History   Occupational History   Occupation: Production assistant, radio   Tobacco Use   Smoking status: Never   Smokeless tobacco: Never  Vaping Use   Vaping status: Never Used  Substance and Sexual Activity   Alcohol use: Yes    Alcohol/week:  6.0 standard drinks of alcohol    Types: 6 Glasses of wine per week    Comment: Wine with dinner nightly   Drug use: No   Sexual activity: Yes    Birth control/protection: Surgical, None    Tobacco Counseling Counseling given: Not Answered   Immunizations and Health Maintenance Immunization History  Administered Date(s) Administered   Influenza Inj Mdck Quad Pf 09/16/2017   Influenza Split 09/11/2007, 08/21/2010   Influenza, Mdck, Trivalent,PF 6+ MOS(egg free) 09/24/2023   Influenza, Seasonal, Injecte, Preservative Fre 10/10/2011, 10/29/2012   Influenza,inj,Quad PF,6+ Mos 09/20/2013, 08/30/2014, 08/14/2015, 09/29/2018, 09/13/2022   Influenza-Unspecified 08/30/2014, 10/18/2016, 09/16/2017, 10/09/2020, 11/01/2021   PFIZER Comirnaty(Gray Top)Covid-19 Tri-Sucrose Vaccine 09/13/2022   PFIZER(Purple Top)SARS-COV-2 Vaccination 03/02/2020, 03/23/2020, 10/09/2020   Pfizer(Comirnaty)Fall Seasonal Vaccine 12 years and older 09/24/2023   Respiratory Syncytial Virus Vaccine,Recomb Aduvanted(Arexvy) 01/07/2023   Td 09/22/2006   Tdap 09/22/2006, 11/28/2016   Zoster Recombinant(Shingrix) 04/30/2021, 06/29/2021   Health Maintenance Due  Topic Date Due   Pneumonia Vaccine 33+ Years old (1 of 2 - PCV) Never done    Activities of Daily Living    02/25/2024    8:02 AM 05/08/2023    7:54 AM  In your present state of  health, do you have any difficulty performing the following activities:  Hearing? 0 0  Vision? 0 0  Difficulty concentrating or making decisions? 0 0  Walking or climbing stairs? 0 0  Dressing or bathing? 0 0  Doing errands, shopping? 0 0  Preparing Food and eating ? N   Using the Toilet? N   In the past six months, have you accidently leaked urine? Y   Do you have problems with loss of bowel control? N   Managing your Medications? N   Managing your Finances? N   Housekeeping or managing your Housekeeping? N     Physical Exam   Physical Exam (optional), or other factors  deemed appropriate based on the beneficiary's medical and social history and current clinical standards.   Advanced Directives: Does Patient Have a Medical Advance Directive?: Yes Type of Advance Directive: Healthcare Power of Attorney, Living will Does patient want to make changes to medical advance directive?: No - Patient declined Copy of Healthcare Power of Attorney in Chart?: No - copy requested   EKG:  normal EKG, normal sinus rhythm, unchanged from previous tracings, RBBB     Assessment:    This is a routine wellness  examination for this patient .   Vision/Hearing screen Vision Screening   Right eye Left eye Both eyes  Without correction     With correction 20/25 20/30 20/25      Goals      Activity and Exercise Increased     Evidence-based guidance:  Review current exercise levels.  Assess patient perspective on exercise or activity level, barriers to increasing activity, motivation and readiness for change.  Recommend or set healthy exercise goal based on individual tolerance.  Encourage small steps toward making change in amount of exercise or activity.  Urge reduction of sedentary activities or screen time.  Promote group activities within the community or with family or support person.  Consider referral to rehabiliation therapist for assessment and exercise/activity plan.   Notes:          Depression Screen    02/25/2024    8:08 AM 01/22/2024    9:17 AM 05/08/2023    7:54 AM 07/31/2022   10:09 AM  PHQ 2/9 Scores  PHQ - 2 Score 0 0 0 0  PHQ- 9 Score 0 0 0 0     Fall Risk    02/25/2024    7:57 AM  Fall Risk   Falls in the past year? 0  Number falls in past yr: 0  Injury with Fall? 0  Risk for fall due to : No Fall Risks  Follow up Falls prevention discussed;Education provided;Falls evaluation completed    Cognitive Function        02/25/2024    8:08 AM  6CIT Screen  What Year? 0 points  What month? 0 points  What time? 0 points  Count  back from 20 0 points  Months in reverse 0 points  Repeat phrase 0 points  Total Score 0 points    Patient Care Team: Alba Cory, MD as PCP - General (Family Medicine) Alba Cory, MD as Attending Physician (Family Medicine) Midge Minium, MD as Consulting Physician (Gastroenterology) Maeola Harman, MD as Consulting Physician (Vascular Surgery)     Plan:    1. Encounter for Medicare annual wellness exam (Primary)  - Visual acuity screening - EKG 12-Lead  2. Need for vaccination with 20-polyvalent pneumococcal conjugate vaccine  - Pneumococcal conjugate vaccine 20-valent (Prevnar 20)  I have personally reviewed and noted the following in the patient's chart:   Medical and social history Use of alcohol, tobacco or illicit drugs  Current medications and supplements Functional ability and status Nutritional status Physical activity Advanced directives List of other physicians Hospitalizations, surgeries, and ER visits in previous 12 months Vitals Screenings to include cognitive, depression, and falls Referrals and appointments  In addition, I have reviewed and discussed with patient certain preventive protocols, quality metrics, and best practice recommendations. A written personalized care plan for preventive services as well as general preventive health recommendations were provided to patient.     Ruel Favors, MD 02/25/2024

## 2024-04-07 ENCOUNTER — Other Ambulatory Visit: Payer: Self-pay | Admitting: Family Medicine

## 2024-05-14 ENCOUNTER — Ambulatory Visit
Admission: EM | Admit: 2024-05-14 | Discharge: 2024-05-14 | Disposition: A | Discharge status: Home / Self Care | Attending: Emergency Medicine | Admitting: Emergency Medicine

## 2024-05-14 DIAGNOSIS — L237 Allergic contact dermatitis due to plants, except food: Secondary | ICD-10-CM

## 2024-05-14 HISTORY — DX: Vitamin B deficiency, unspecified: E53.9

## 2024-05-14 MED ORDER — PREDNISONE 10 MG (21) PO TBPK: ORAL_TABLET | Freq: Every day | ORAL | 0 refills | Status: DC

## 2024-05-14 NOTE — ED Triage Notes (Signed)
 Pt exposed to poison ivy approx 12 days ago.  Was on prednisone but it only 'half' improved. On L arm, bilateral legs, some torso. Has used hydrocortisone cream and other cream.

## 2024-05-14 NOTE — ED Provider Notes (Signed)
 Willie Atkins    CSN: [id number] Arrival date & time: 05/14/24  0903      History   Chief Complaint Chief Complaint  Patient presents with   Poison Ivy    X 2 wks     HPI Willie Atkins is a 63 y.o. male.  Patient presents with 2-week history of pruritic rash on his trunk and extremities after he came in contact with poison ivy while working in his yard.  He had an online telehealth visit and was prescribed prednisone.  He completed the prednisone 5 days ago.  He has taken some antihistamines also; none in the last 5 days.  He has also attempted treatment with hydrocortisone cream.  No fever or purulent drainage.  The history is provided by the patient and medical records.    Past Medical History:  Diagnosis Date   Allergy    Anxiety    Arthritis    Depression    Elevated hematocrit    Heart murmur 1978   Hematuria syndrome    Hyperlipidemia    Insomnia    Left knee pain    Obesity    Old tear of meniscus of left knee    OSA (obstructive sleep apnea)    Pain medication agreement    Sleep apnea    Tendonitis, Achilles, left    Testicular failure    Vitamin B deficiency     Patient Active Problem List   Diagnosis Date Noted   Leg edema, right 01/22/2024   Arthritis 08/13/2021   Polyp of colon    Gynecomastia, male 08/31/2015   Mitral valve disorder 08/15/2015   Dyslipidemia 08/14/2015   Hypogonadism male 08/14/2015   Metabolic syndrome 08/14/2015   Depression with anxiety 08/14/2015   OSA on CPAP 08/14/2015   S/P arthroscopic surgery of left knee 08/14/2015   Umbilical hernia without obstruction and without gangrene 08/14/2015    Past Surgical History:  Procedure Laterality Date   COLONOSCOPY WITH PROPOFOL  N/A 03/01/2021   Procedure: COLONOSCOPY WITH PROPOFOL ;  Surgeon: Irby Mannan, MD;  Location: [facility] ENDOSCOPY;  Service: Endoscopy;  Laterality: N/A;   KNEE SURGERY Left 08/2014   VASECTOMY         Home Medications    Prior to  Admission medications   Medication Sig Start Date End Date Taking? Authorizing Provider  predniSONE (STERAPRED UNI-PAK 21 TAB) 10 MG (21) TBPK tablet Take by mouth daily. As directed 05/14/24  Yes Wellington Half, NP  cyanocobalamin 1000 MCG tablet Take 1,000 mcg by mouth daily.    [provider]  rosuvastatin  (CRESTOR ) 20 MG tablet TAKE 1 TABLET BY MOUTH EVERY DAY 04/07/24   Sowles, Krichna, MD  tadalafil  (CIALIS ) 10 MG tablet Take 1-2 tablets (10-20 mg total) by mouth every 3 (three) days as needed for erectile dysfunction. 01/22/24   Sowles, Krichna, MD    Family History Family History  Problem Relation Age of Onset   Cancer Mother    Heart disease Father    Stroke Father     Social History Social History   Tobacco Use   Smoking status: Never   Smokeless tobacco: Never  Vaping Use   Vaping status: Never Used  Substance Use Topics   Alcohol use: Yes    Alcohol/week: 6.0 standard drinks of alcohol    Types: 6 Glasses of wine per week    Comment: Wine with dinner nightly   Drug use: No     Allergies   Patient  has no known allergies.   Review of Systems Review of Systems  Constitutional:  Negative for chills and fever.  HENT:  Negative for sore throat, trouble swallowing and voice change.   Respiratory:  Negative for cough and shortness of breath.   Skin:  Positive for color change and rash.     Physical Exam Triage Vital Signs ED Triage Vitals  Encounter Vitals Group     BP 05/14/24 0914 (!) 143/89     Systolic BP Percentile --      Diastolic BP Percentile --      Pulse Rate 05/14/24 0914 85     Resp 05/14/24 0914 20     Temp 05/14/24 0914 98.6 F (37 C)     Temp Source 05/14/24 0914 Oral     SpO2 05/14/24 0914 100 %     Weight --      Height --      Head Circumference --      Peak Flow --      Pain Score 05/14/24 0911 0     Pain Loc --      Pain Education --      Exclude from Growth Chart --    No data found.  Updated Vital Signs BP (!)  143/89 (BP Location: Right Arm)   Pulse 85   Temp 98.6 F (37 C) (Oral)   Resp 20   SpO2 100%   Visual Acuity Right Eye Distance:   Left Eye Distance:   Bilateral Distance:    Right Eye Near:   Left Eye Near:    Bilateral Near:     Physical Exam Constitutional:      General: He is not in acute distress. HENT:     Mouth/Throat:     Mouth: Mucous membranes are moist.  Cardiovascular:     Rate and Rhythm: Normal rate and regular rhythm.  Pulmonary:     Effort: Pulmonary effort is normal. No respiratory distress.  Skin:    General: Skin is warm and dry.     Findings: Rash present.     Comments: Erythematous maculopapular patchy rash on trunk and extremities.  No purulent drainage.  Neurological:     Mental Status: He is alert.      UC Treatments / Results  Labs (all labs ordered are listed, but only abnormal results are displayed) Labs Reviewed - No data to display  EKG   Radiology No results found.  Procedures Procedures (including critical care time)  Medications Ordered in UC Medications - No data to display  Initial Impression / Assessment and Plan / UC Course  I have reviewed the triage vital signs and the nursing notes.  Pertinent labs & imaging results that were available during my care of the patient were reviewed by me and considered in my medical decision making (see chart for details).   Poison ivy dermatitis.  Afebrile and vital signs are stable.  Discussed with patient that he needs to take Zyrtec daily at bedtime for 14 to 21 days.  Also treating today with prednisone  taper.  Discussed calamine lotion.  Education provided on poison ivy dermatitis.  Instructed him to follow-up with his PCP if he is not improving.  He agrees to plan of care.  Final Clinical Impressions(s) / UC Diagnoses   Final diagnoses:  Poison ivy dermatitis     Discharge Instructions      Take Zyrtec and prednisone  as directed.  Use calamine lotion as  directed.  Follow-up  with your primary care provider if your symptoms are not improving.    ED Prescriptions     Medication Sig Dispense Auth. Provider   predniSONE (STERAPRED UNI-PAK 21 TAB) 10 MG (21) TBPK tablet Take by mouth daily. As directed 21 tablet Wellington Half, NP      PDMP not reviewed this encounter.   Wellington Half, NP 05/14/24 [phone]

## 2024-05-14 NOTE — Discharge Instructions (Addendum)
 Take Zyrtec and prednisone as directed.  Use calamine lotion as directed.  Follow-up with your primary care provider if your symptoms are not improving.

## 2024-10-04 ENCOUNTER — Other Ambulatory Visit: Payer: Self-pay | Admitting: Family Medicine

## 2024-10-07 ENCOUNTER — Encounter: Payer: Self-pay | Admitting: Family Medicine

## 2024-10-07 ENCOUNTER — Ambulatory Visit (INDEPENDENT_AMBULATORY_CARE_PROVIDER_SITE_OTHER): Admitting: Family Medicine

## 2024-10-07 DIAGNOSIS — E538 Deficiency of other specified B group vitamins: Secondary | ICD-10-CM | POA: Diagnosis not present

## 2024-10-07 DIAGNOSIS — G4733 Obstructive sleep apnea (adult) (pediatric): Secondary | ICD-10-CM | POA: Diagnosis not present

## 2024-10-07 DIAGNOSIS — E785 Hyperlipidemia, unspecified: Secondary | ICD-10-CM

## 2024-10-07 DIAGNOSIS — N528 Other male erectile dysfunction: Secondary | ICD-10-CM

## 2024-10-07 MED ORDER — TADALAFIL 10 MG PO TABS: 10.0000 mg | ORAL_TABLET | ORAL | 1 refills | Status: AC | PRN

## 2024-10-07 MED ORDER — ROSUVASTATIN CALCIUM 20 MG PO TABS: 20.0000 mg | ORAL_TABLET | Freq: Every day | ORAL | 3 refills | Status: AC

## 2024-10-07 NOTE — Progress Notes (Signed)
 Name: Willie Atkins   MRN: [id number]    DOB: 03/04/1961   Date:10/07/2024       Progress Note  Subjective  Chief Complaint  Chief Complaint  Patient presents with   Medical Management of Chronic Issues   Discussed the use of AI scribe software for clinical note transcription with the patient, who gave verbal consent to proceed.  History of Present Illness Willie Atkins is a 63 year old male who presents for a routine follow-up visit.  He has no new issues since his last visit. He retired about a year ago and has been traveling frequently since then. He is now covered by Medicare.  He is currently taking rosuvastatin  for dyslipidemia and tolerates it well. His cholesterol levels have improved, with LDL cholesterol down to 37 mg/dL and HDL cholesterol at 41 mg/dL, which is better than previous levels.  He uses a CPAP machine for obstructive sleep apnea and is 100% compliant, stating he has not slept without it in ten years except for a few instances on airplanes. He feels much more rested since using the CPAP, noting significant improvement in his sleep quality compared to before.  He takes Cialis  10 mg for erectile dysfunction, which he finds adequate for his needs. He previously used testosterone supplementation for hypogonadism but has since discontinued it.    Patient Active Problem List   Diagnosis Date Noted   Leg edema, right 01/22/2024   Arthritis 08/13/2021   Polyp of colon    Gynecomastia, male 08/31/2015   Mitral valve disorder 08/15/2015   Dyslipidemia 08/14/2015   Hypogonadism male 08/14/2015   Metabolic syndrome 08/14/2015   Depression with anxiety 08/14/2015   OSA on CPAP 08/14/2015   S/P arthroscopic surgery of left knee 08/14/2015   Umbilical hernia without obstruction and without gangrene 08/14/2015    Past Surgical History:  Procedure Laterality Date   COLONOSCOPY WITH PROPOFOL  N/A 03/01/2021   Procedure: COLONOSCOPY WITH PROPOFOL ;  Surgeon: Janalyn Keene NOVAK, MD;  Location: [facility] ENDOSCOPY;  Service: Endoscopy;  Laterality: N/A;   KNEE SURGERY Left 08/2014   VASECTOMY      Family History  Problem Relation Age of Onset   Cancer Mother    Heart disease Father    Stroke Father     Social History   Tobacco Use   Smoking status: Never   Smokeless tobacco: Never  Substance Use Topics   Alcohol use: Yes    Alcohol/week: 6.0 standard drinks of alcohol    Types: 6 Glasses of wine per week    Comment: Wine with dinner nightly     Current Outpatient Medications:    cyanocobalamin 1000 MCG tablet, Take 1,000 mcg by mouth daily., Disp: , Rfl:    rosuvastatin  (CRESTOR ) 20 MG tablet, TAKE 1 TABLET BY MOUTH EVERY DAY, Disp: 90 tablet, Rfl: 1   tadalafil  (CIALIS ) 10 MG tablet, Take 1-2 tablets (10-20 mg total) by mouth every 3 (three) days as needed for erectile dysfunction., Disp: 90 tablet, Rfl: 0   predniSONE  (STERAPRED UNI-PAK 21 TAB) 10 MG (21) TBPK tablet, Take by mouth daily. As directed (Patient not taking: Reported on 10/07/2024), Disp: 21 tablet, Rfl: 0  No Known Allergies  I personally reviewed active problem list, medication list, allergies, family history with the patient/caregiver today.   ROS  Ten systems reviewed and is negative except as mentioned in HPI    Objective Physical Exam CONSTITUTIONAL: Patient appears well-developed and well-nourished.  No distress. HEENT: Head atraumatic,  normocephalic, neck supple. CARDIOVASCULAR: Normal rate, regular rhythm and normal heart sounds.  No murmur heard. No BLE edema. PULMONARY: Effort normal and breath sounds normal. No respiratory distress. ABDOMINAL: There is no tenderness or distention. MUSCULOSKELETAL: Normal gait. Without gross motor or sensory deficit. PSYCHIATRIC: Patient has a normal mood and affect. behavior is normal. Judgment and thought content normal.  Vitals:   10/07/24 1035  BP: 128/74  Pulse: 95  Resp: 16  SpO2: 99%  Weight: 267 lb 9.6 oz (121.4  kg)  Height: 6' 1 (1.854 m)    Body mass index is 35.31 kg/m.   PHQ2/9:    10/07/2024   10:32 AM 02/25/2024    8:08 AM 01/22/2024    9:17 AM 05/08/2023    7:54 AM 07/31/2022   10:09 AM  Depression screen PHQ 2/9  Decreased Interest 0 0 0 0 0  Down, Depressed, Hopeless 0 0 0 0 0  PHQ - 2 Score 0 0 0 0 0  Altered sleeping  0 0 0 0  Tired, decreased energy  0 0 0 0  Change in appetite  0 0 0 0  Feeling bad or failure about yourself   0 0 0 0  Trouble concentrating  0 0 0 0  Moving slowly or fidgety/restless  0 0 0 0  Suicidal thoughts  0 0 0 0  PHQ-9 Score  0 0 0 0  Difficult doing work/chores  Not difficult at all Not difficult at all Not difficult at all     phq 9 is negative  Fall Risk:    10/07/2024   10:31 AM 02/25/2024    7:57 AM 01/22/2024    9:17 AM 05/08/2023    7:54 AM 07/31/2022   10:09 AM  Fall Risk   Falls in the past year? 0 0 0 1 0  Number falls in past yr: 0 0 0 0 0  Injury with Fall? 0 0 0 0 0  Risk for fall due to : No Fall Risks No Fall Risks No Fall Risks Impaired balance/gait No Fall Risks  Follow up Falls evaluation completed Falls prevention discussed;Education provided;Falls evaluation completed Falls prevention discussed;Education provided;Falls evaluation completed Falls prevention discussed;Education provided;Falls evaluation completed Falls prevention discussed      Data saved with a previous flowsheet row definition     Assessment & Plan  Excellent CPAP compliance for obstructive sleep apnea.  - Continue current medications and CPAP use. - Provide a one-year supply of rosuvastatin . - Schedule annual follow-up and physical exam.  Obstructive Sleep Apnea Well-managed with CPAP. 100% compliance with improved restfulness and alertness. - Continue CPAP therapy.  Dyslipidemia Well-managed with rosuvastatin . LDL at 37 mg/dL, HDL at 41 mg/dL, previously in twenties. - Continue rosuvastatin . - Encourage consumption of tree nuts to improve HDL  levels.  Vitamin B12 Deficiency Managed with B12 supplementation. No issues reported. - Continue B12 supplementation.  Erectile Dysfunction Managed with Cialis  10 mg, effective and well-tolerated. Sexual activity improved, currently reduced due to wife's UTIs. - Continue Cialis  10 mg as needed.  Morbid Obesity - BMI over 35 with co-morbidities such as dyslipidemia and OSA - advised to cut down on fast food

## 2024-11-09 ENCOUNTER — Ambulatory Visit: Admitting: Family Medicine

## 2025-01-24 ENCOUNTER — Ambulatory Visit: Payer: Medicare Other | Admitting: Family Medicine
